# Patient Record
Sex: Female | Born: 1980 | Race: Black or African American | Marital: Single | Smoking: Former smoker
Health system: Southern US, Community
[De-identification: ages and names within clinical notes are randomized; demographics above are authoritative.]

## PROBLEM LIST (undated history)

## (undated) DIAGNOSIS — G4730 Sleep apnea, unspecified: Secondary | ICD-10-CM

## (undated) DIAGNOSIS — J45909 Unspecified asthma, uncomplicated: Secondary | ICD-10-CM

## (undated) DIAGNOSIS — M5137 Other intervertebral disc degeneration, lumbosacral region: Secondary | ICD-10-CM

## (undated) DIAGNOSIS — F32A Depression, unspecified: Secondary | ICD-10-CM

## (undated) DIAGNOSIS — D649 Anemia, unspecified: Secondary | ICD-10-CM

## (undated) DIAGNOSIS — K219 Gastro-esophageal reflux disease without esophagitis: Secondary | ICD-10-CM

## (undated) DIAGNOSIS — F419 Anxiety disorder, unspecified: Secondary | ICD-10-CM

## (undated) DIAGNOSIS — M51379 Other intervertebral disc degeneration, lumbosacral region without mention of lumbar back pain or lower extremity pain: Secondary | ICD-10-CM

## (undated) HISTORY — DX: Gastro-esophageal reflux disease without esophagitis: K21.9

## (undated) HISTORY — DX: Other intervertebral disc degeneration, lumbosacral region: M51.37

## (undated) HISTORY — DX: Sleep apnea, unspecified: G47.30

## (undated) HISTORY — DX: Other intervertebral disc degeneration, lumbosacral region without mention of lumbar back pain or lower extremity pain: M51.379

## (undated) HISTORY — DX: Depression, unspecified: F32.A

---

## 2008-10-20 DIAGNOSIS — L708 Other acne: Secondary | ICD-10-CM

## 2008-10-20 HISTORY — DX: Other acne: L70.8

## 2010-09-10 DIAGNOSIS — F329 Major depressive disorder, single episode, unspecified: Secondary | ICD-10-CM | POA: Insufficient documentation

## 2010-10-08 DIAGNOSIS — G43909 Migraine, unspecified, not intractable, without status migrainosus: Secondary | ICD-10-CM

## 2010-10-08 HISTORY — DX: Migraine, unspecified, not intractable, without status migrainosus: G43.909

## 2010-12-13 DIAGNOSIS — H6990 Unspecified Eustachian tube disorder, unspecified ear: Secondary | ICD-10-CM

## 2010-12-13 HISTORY — DX: Unspecified eustachian tube disorder, unspecified ear: H69.90

## 2011-04-05 DIAGNOSIS — R0789 Other chest pain: Secondary | ICD-10-CM

## 2011-04-05 DIAGNOSIS — Z8249 Family history of ischemic heart disease and other diseases of the circulatory system: Secondary | ICD-10-CM | POA: Insufficient documentation

## 2011-04-05 HISTORY — DX: Other chest pain: R07.89

## 2012-01-10 DIAGNOSIS — B977 Papillomavirus as the cause of diseases classified elsewhere: Secondary | ICD-10-CM | POA: Insufficient documentation

## 2014-08-08 DIAGNOSIS — F909 Attention-deficit hyperactivity disorder, unspecified type: Secondary | ICD-10-CM | POA: Insufficient documentation

## 2014-08-08 HISTORY — DX: Attention-deficit hyperactivity disorder, unspecified type: F90.9

## 2014-08-28 DIAGNOSIS — R87612 Low grade squamous intraepithelial lesion on cytologic smear of cervix (LGSIL): Secondary | ICD-10-CM | POA: Insufficient documentation

## 2014-10-17 DIAGNOSIS — M545 Low back pain, unspecified: Secondary | ICD-10-CM

## 2014-10-17 HISTORY — DX: Low back pain, unspecified: M54.50

## 2018-11-16 DIAGNOSIS — J4540 Moderate persistent asthma, uncomplicated: Secondary | ICD-10-CM | POA: Insufficient documentation

## 2018-11-16 DIAGNOSIS — J4530 Mild persistent asthma, uncomplicated: Secondary | ICD-10-CM | POA: Insufficient documentation

## 2020-11-11 DIAGNOSIS — Z20822 Contact with and (suspected) exposure to covid-19: Secondary | ICD-10-CM | POA: Diagnosis not present

## 2020-11-11 DIAGNOSIS — H6506 Acute serous otitis media, recurrent, bilateral: Secondary | ICD-10-CM | POA: Diagnosis not present

## 2020-11-11 DIAGNOSIS — J3489 Other specified disorders of nose and nasal sinuses: Secondary | ICD-10-CM | POA: Diagnosis not present

## 2020-11-11 DIAGNOSIS — J45909 Unspecified asthma, uncomplicated: Secondary | ICD-10-CM | POA: Diagnosis not present

## 2020-11-11 DIAGNOSIS — J0100 Acute maxillary sinusitis, unspecified: Secondary | ICD-10-CM | POA: Diagnosis not present

## 2020-11-14 DIAGNOSIS — L259 Unspecified contact dermatitis, unspecified cause: Secondary | ICD-10-CM | POA: Diagnosis not present

## 2020-11-15 DIAGNOSIS — R079 Chest pain, unspecified: Secondary | ICD-10-CM | POA: Diagnosis not present

## 2020-11-15 DIAGNOSIS — R202 Paresthesia of skin: Secondary | ICD-10-CM | POA: Diagnosis not present

## 2020-11-15 DIAGNOSIS — R0789 Other chest pain: Secondary | ICD-10-CM | POA: Diagnosis not present

## 2020-11-15 DIAGNOSIS — Z79899 Other long term (current) drug therapy: Secondary | ICD-10-CM | POA: Diagnosis not present

## 2020-11-15 DIAGNOSIS — F411 Generalized anxiety disorder: Secondary | ICD-10-CM | POA: Diagnosis not present

## 2020-11-15 DIAGNOSIS — R002 Palpitations: Secondary | ICD-10-CM | POA: Diagnosis not present

## 2020-11-15 DIAGNOSIS — I517 Cardiomegaly: Secondary | ICD-10-CM | POA: Diagnosis not present

## 2020-11-15 DIAGNOSIS — R0989 Other specified symptoms and signs involving the circulatory and respiratory systems: Secondary | ICD-10-CM | POA: Diagnosis not present

## 2020-11-15 DIAGNOSIS — Z7951 Long term (current) use of inhaled steroids: Secondary | ICD-10-CM | POA: Diagnosis not present

## 2020-11-15 DIAGNOSIS — R519 Headache, unspecified: Secondary | ICD-10-CM | POA: Diagnosis not present

## 2020-11-15 DIAGNOSIS — R200 Anesthesia of skin: Secondary | ICD-10-CM | POA: Diagnosis not present

## 2020-11-15 DIAGNOSIS — Z87891 Personal history of nicotine dependence: Secondary | ICD-10-CM | POA: Diagnosis not present

## 2020-11-17 DIAGNOSIS — D649 Anemia, unspecified: Secondary | ICD-10-CM | POA: Diagnosis not present

## 2020-11-17 DIAGNOSIS — F411 Generalized anxiety disorder: Secondary | ICD-10-CM | POA: Diagnosis not present

## 2020-11-17 DIAGNOSIS — F331 Major depressive disorder, recurrent, moderate: Secondary | ICD-10-CM | POA: Diagnosis not present

## 2020-11-17 DIAGNOSIS — E559 Vitamin D deficiency, unspecified: Secondary | ICD-10-CM | POA: Diagnosis not present

## 2020-11-17 DIAGNOSIS — R251 Tremor, unspecified: Secondary | ICD-10-CM | POA: Diagnosis not present

## 2020-11-17 DIAGNOSIS — R52 Pain, unspecified: Secondary | ICD-10-CM | POA: Diagnosis not present

## 2020-11-17 DIAGNOSIS — R3589 Other polyuria: Secondary | ICD-10-CM | POA: Diagnosis not present

## 2020-11-19 DIAGNOSIS — R21 Rash and other nonspecific skin eruption: Secondary | ICD-10-CM | POA: Diagnosis not present

## 2020-11-19 DIAGNOSIS — E559 Vitamin D deficiency, unspecified: Secondary | ICD-10-CM | POA: Diagnosis not present

## 2020-11-20 DIAGNOSIS — R202 Paresthesia of skin: Secondary | ICD-10-CM | POA: Diagnosis not present

## 2020-11-20 DIAGNOSIS — F410 Panic disorder [episodic paroxysmal anxiety] without agoraphobia: Secondary | ICD-10-CM | POA: Diagnosis not present

## 2020-11-20 DIAGNOSIS — R200 Anesthesia of skin: Secondary | ICD-10-CM | POA: Diagnosis not present

## 2020-11-20 DIAGNOSIS — R531 Weakness: Secondary | ICD-10-CM | POA: Diagnosis not present

## 2020-11-22 DIAGNOSIS — F410 Panic disorder [episodic paroxysmal anxiety] without agoraphobia: Secondary | ICD-10-CM | POA: Insufficient documentation

## 2020-11-22 HISTORY — DX: Panic disorder (episodic paroxysmal anxiety): F41.0

## 2020-11-23 DIAGNOSIS — F410 Panic disorder [episodic paroxysmal anxiety] without agoraphobia: Secondary | ICD-10-CM | POA: Diagnosis not present

## 2020-11-23 DIAGNOSIS — K529 Noninfective gastroenteritis and colitis, unspecified: Secondary | ICD-10-CM | POA: Diagnosis not present

## 2020-11-23 DIAGNOSIS — M545 Low back pain, unspecified: Secondary | ICD-10-CM | POA: Diagnosis not present

## 2020-11-23 DIAGNOSIS — F900 Attention-deficit hyperactivity disorder, predominantly inattentive type: Secondary | ICD-10-CM | POA: Diagnosis not present

## 2020-11-25 DIAGNOSIS — F410 Panic disorder [episodic paroxysmal anxiety] without agoraphobia: Secondary | ICD-10-CM | POA: Diagnosis not present

## 2020-11-25 DIAGNOSIS — D179 Benign lipomatous neoplasm, unspecified: Secondary | ICD-10-CM | POA: Diagnosis not present

## 2020-11-25 DIAGNOSIS — R202 Paresthesia of skin: Secondary | ICD-10-CM | POA: Diagnosis not present

## 2020-11-25 DIAGNOSIS — D649 Anemia, unspecified: Secondary | ICD-10-CM | POA: Diagnosis not present

## 2020-12-07 DIAGNOSIS — D179 Benign lipomatous neoplasm, unspecified: Secondary | ICD-10-CM | POA: Diagnosis not present

## 2020-12-07 DIAGNOSIS — Z23 Encounter for immunization: Secondary | ICD-10-CM | POA: Diagnosis not present

## 2020-12-08 DIAGNOSIS — J4540 Moderate persistent asthma, uncomplicated: Secondary | ICD-10-CM | POA: Diagnosis not present

## 2020-12-08 DIAGNOSIS — F419 Anxiety disorder, unspecified: Secondary | ICD-10-CM | POA: Insufficient documentation

## 2020-12-08 DIAGNOSIS — F901 Attention-deficit hyperactivity disorder, predominantly hyperactive type: Secondary | ICD-10-CM | POA: Diagnosis not present

## 2020-12-08 HISTORY — DX: Anxiety disorder, unspecified: F41.9

## 2020-12-11 DIAGNOSIS — R251 Tremor, unspecified: Secondary | ICD-10-CM | POA: Diagnosis not present

## 2020-12-11 DIAGNOSIS — F410 Panic disorder [episodic paroxysmal anxiety] without agoraphobia: Secondary | ICD-10-CM | POA: Diagnosis not present

## 2020-12-11 DIAGNOSIS — J4530 Mild persistent asthma, uncomplicated: Secondary | ICD-10-CM | POA: Diagnosis not present

## 2020-12-29 DIAGNOSIS — R0683 Snoring: Secondary | ICD-10-CM | POA: Diagnosis not present

## 2020-12-29 DIAGNOSIS — G4730 Sleep apnea, unspecified: Secondary | ICD-10-CM | POA: Diagnosis not present

## 2020-12-29 DIAGNOSIS — G4700 Insomnia, unspecified: Secondary | ICD-10-CM | POA: Diagnosis not present

## 2020-12-29 DIAGNOSIS — Z202 Contact with and (suspected) exposure to infections with a predominantly sexual mode of transmission: Secondary | ICD-10-CM | POA: Diagnosis not present

## 2021-01-05 DIAGNOSIS — G4700 Insomnia, unspecified: Secondary | ICD-10-CM

## 2021-01-05 HISTORY — DX: Insomnia, unspecified: G47.00

## 2021-01-14 DIAGNOSIS — G4700 Insomnia, unspecified: Secondary | ICD-10-CM | POA: Diagnosis not present

## 2021-01-14 DIAGNOSIS — G4730 Sleep apnea, unspecified: Secondary | ICD-10-CM | POA: Diagnosis not present

## 2021-01-14 DIAGNOSIS — R0683 Snoring: Secondary | ICD-10-CM | POA: Diagnosis not present

## 2021-01-25 DIAGNOSIS — R400 Somnolence: Secondary | ICD-10-CM | POA: Diagnosis not present

## 2021-01-25 DIAGNOSIS — G4700 Insomnia, unspecified: Secondary | ICD-10-CM | POA: Diagnosis not present

## 2021-01-25 DIAGNOSIS — F410 Panic disorder [episodic paroxysmal anxiety] without agoraphobia: Secondary | ICD-10-CM | POA: Diagnosis not present

## 2021-01-25 DIAGNOSIS — K219 Gastro-esophageal reflux disease without esophagitis: Secondary | ICD-10-CM | POA: Diagnosis not present

## 2021-01-25 DIAGNOSIS — E559 Vitamin D deficiency, unspecified: Secondary | ICD-10-CM | POA: Insufficient documentation

## 2021-01-25 DIAGNOSIS — F900 Attention-deficit hyperactivity disorder, predominantly inattentive type: Secondary | ICD-10-CM | POA: Diagnosis not present

## 2021-02-03 DIAGNOSIS — R251 Tremor, unspecified: Secondary | ICD-10-CM | POA: Diagnosis not present

## 2021-02-15 DIAGNOSIS — M5136 Other intervertebral disc degeneration, lumbar region: Secondary | ICD-10-CM | POA: Diagnosis not present

## 2021-02-15 DIAGNOSIS — M47816 Spondylosis without myelopathy or radiculopathy, lumbar region: Secondary | ICD-10-CM | POA: Diagnosis not present

## 2021-03-17 DIAGNOSIS — G4733 Obstructive sleep apnea (adult) (pediatric): Secondary | ICD-10-CM | POA: Diagnosis not present

## 2021-04-02 DIAGNOSIS — D649 Anemia, unspecified: Secondary | ICD-10-CM | POA: Diagnosis not present

## 2021-04-02 DIAGNOSIS — R609 Edema, unspecified: Secondary | ICD-10-CM | POA: Diagnosis not present

## 2021-04-02 DIAGNOSIS — E559 Vitamin D deficiency, unspecified: Secondary | ICD-10-CM | POA: Diagnosis not present

## 2021-04-02 DIAGNOSIS — M545 Low back pain, unspecified: Secondary | ICD-10-CM | POA: Diagnosis not present

## 2021-04-06 DIAGNOSIS — N907 Vulvar cyst: Secondary | ICD-10-CM | POA: Diagnosis not present

## 2021-04-06 DIAGNOSIS — Z1151 Encounter for screening for human papillomavirus (HPV): Secondary | ICD-10-CM | POA: Diagnosis not present

## 2021-04-06 DIAGNOSIS — Z124 Encounter for screening for malignant neoplasm of cervix: Secondary | ICD-10-CM | POA: Diagnosis not present

## 2021-04-06 DIAGNOSIS — B373 Candidiasis of vulva and vagina: Secondary | ICD-10-CM | POA: Diagnosis not present

## 2021-04-06 LAB — HM PAP SMEAR

## 2021-04-16 DIAGNOSIS — R609 Edema, unspecified: Secondary | ICD-10-CM | POA: Diagnosis not present

## 2021-04-16 DIAGNOSIS — G4733 Obstructive sleep apnea (adult) (pediatric): Secondary | ICD-10-CM | POA: Diagnosis not present

## 2021-05-17 DIAGNOSIS — G4733 Obstructive sleep apnea (adult) (pediatric): Secondary | ICD-10-CM | POA: Diagnosis not present

## 2021-06-17 DIAGNOSIS — G4733 Obstructive sleep apnea (adult) (pediatric): Secondary | ICD-10-CM | POA: Diagnosis not present

## 2021-06-24 DIAGNOSIS — M5136 Other intervertebral disc degeneration, lumbar region: Secondary | ICD-10-CM | POA: Diagnosis not present

## 2021-06-24 DIAGNOSIS — I10 Essential (primary) hypertension: Secondary | ICD-10-CM | POA: Diagnosis not present

## 2021-06-24 DIAGNOSIS — M47816 Spondylosis without myelopathy or radiculopathy, lumbar region: Secondary | ICD-10-CM | POA: Diagnosis not present

## 2021-06-24 DIAGNOSIS — Z6841 Body Mass Index (BMI) 40.0 and over, adult: Secondary | ICD-10-CM | POA: Diagnosis not present

## 2021-07-14 DIAGNOSIS — M5441 Lumbago with sciatica, right side: Secondary | ICD-10-CM | POA: Diagnosis not present

## 2021-07-14 DIAGNOSIS — G8929 Other chronic pain: Secondary | ICD-10-CM | POA: Diagnosis not present

## 2021-07-14 DIAGNOSIS — B3731 Acute candidiasis of vulva and vagina: Secondary | ICD-10-CM | POA: Diagnosis not present

## 2021-08-02 DIAGNOSIS — R7301 Impaired fasting glucose: Secondary | ICD-10-CM | POA: Diagnosis not present

## 2021-08-02 DIAGNOSIS — Z6841 Body Mass Index (BMI) 40.0 and over, adult: Secondary | ICD-10-CM | POA: Diagnosis not present

## 2021-08-02 DIAGNOSIS — F411 Generalized anxiety disorder: Secondary | ICD-10-CM | POA: Diagnosis not present

## 2021-09-01 DIAGNOSIS — R079 Chest pain, unspecified: Secondary | ICD-10-CM | POA: Diagnosis not present

## 2021-09-16 DIAGNOSIS — F419 Anxiety disorder, unspecified: Secondary | ICD-10-CM | POA: Diagnosis not present

## 2021-09-16 DIAGNOSIS — R051 Acute cough: Secondary | ICD-10-CM | POA: Diagnosis not present

## 2021-09-16 DIAGNOSIS — Z20822 Contact with and (suspected) exposure to covid-19: Secondary | ICD-10-CM | POA: Diagnosis not present

## 2021-09-16 DIAGNOSIS — R0981 Nasal congestion: Secondary | ICD-10-CM | POA: Diagnosis not present

## 2021-09-22 DIAGNOSIS — R42 Dizziness and giddiness: Secondary | ICD-10-CM | POA: Diagnosis not present

## 2021-09-22 DIAGNOSIS — R109 Unspecified abdominal pain: Secondary | ICD-10-CM | POA: Diagnosis not present

## 2021-09-22 DIAGNOSIS — F410 Panic disorder [episodic paroxysmal anxiety] without agoraphobia: Secondary | ICD-10-CM | POA: Diagnosis not present

## 2021-09-22 DIAGNOSIS — D649 Anemia, unspecified: Secondary | ICD-10-CM | POA: Diagnosis not present

## 2021-09-22 DIAGNOSIS — F5231 Female orgasmic disorder: Secondary | ICD-10-CM | POA: Diagnosis not present

## 2021-09-24 DIAGNOSIS — Z79899 Other long term (current) drug therapy: Secondary | ICD-10-CM | POA: Diagnosis not present

## 2021-09-24 DIAGNOSIS — R42 Dizziness and giddiness: Secondary | ICD-10-CM | POA: Diagnosis not present

## 2021-09-24 DIAGNOSIS — Z87891 Personal history of nicotine dependence: Secondary | ICD-10-CM | POA: Diagnosis not present

## 2021-09-24 DIAGNOSIS — R0789 Other chest pain: Secondary | ICD-10-CM | POA: Diagnosis not present

## 2021-09-24 DIAGNOSIS — Z7985 Long-term (current) use of injectable non-insulin antidiabetic drugs: Secondary | ICD-10-CM | POA: Diagnosis not present

## 2021-09-24 DIAGNOSIS — F419 Anxiety disorder, unspecified: Secondary | ICD-10-CM | POA: Diagnosis not present

## 2021-09-24 DIAGNOSIS — F4321 Adjustment disorder with depressed mood: Secondary | ICD-10-CM | POA: Diagnosis not present

## 2021-09-24 DIAGNOSIS — Z7951 Long term (current) use of inhaled steroids: Secondary | ICD-10-CM | POA: Diagnosis not present

## 2021-09-24 DIAGNOSIS — R202 Paresthesia of skin: Secondary | ICD-10-CM | POA: Diagnosis not present

## 2021-09-24 DIAGNOSIS — R200 Anesthesia of skin: Secondary | ICD-10-CM | POA: Diagnosis not present

## 2021-09-24 DIAGNOSIS — F439 Reaction to severe stress, unspecified: Secondary | ICD-10-CM | POA: Diagnosis not present

## 2021-09-25 DIAGNOSIS — R000 Tachycardia, unspecified: Secondary | ICD-10-CM | POA: Diagnosis not present

## 2021-09-27 DIAGNOSIS — F411 Generalized anxiety disorder: Secondary | ICD-10-CM | POA: Diagnosis not present

## 2021-09-27 DIAGNOSIS — R42 Dizziness and giddiness: Secondary | ICD-10-CM | POA: Diagnosis not present

## 2021-09-27 DIAGNOSIS — R457 State of emotional shock and stress, unspecified: Secondary | ICD-10-CM | POA: Diagnosis not present

## 2021-09-27 DIAGNOSIS — R000 Tachycardia, unspecified: Secondary | ICD-10-CM | POA: Diagnosis not present

## 2021-09-27 DIAGNOSIS — F410 Panic disorder [episodic paroxysmal anxiety] without agoraphobia: Secondary | ICD-10-CM | POA: Diagnosis not present

## 2021-09-27 DIAGNOSIS — R064 Hyperventilation: Secondary | ICD-10-CM | POA: Diagnosis not present

## 2021-09-29 DIAGNOSIS — Z3202 Encounter for pregnancy test, result negative: Secondary | ICD-10-CM | POA: Diagnosis not present

## 2021-09-29 DIAGNOSIS — R42 Dizziness and giddiness: Secondary | ICD-10-CM | POA: Diagnosis not present

## 2021-09-29 DIAGNOSIS — R87810 Cervical high risk human papillomavirus (HPV) DNA test positive: Secondary | ICD-10-CM | POA: Diagnosis not present

## 2021-09-29 DIAGNOSIS — N644 Mastodynia: Secondary | ICD-10-CM | POA: Diagnosis not present

## 2021-09-29 DIAGNOSIS — R4020 Unspecified coma: Secondary | ICD-10-CM | POA: Diagnosis not present

## 2021-09-29 DIAGNOSIS — R87612 Low grade squamous intraepithelial lesion on cytologic smear of cervix (LGSIL): Secondary | ICD-10-CM | POA: Diagnosis not present

## 2021-09-29 DIAGNOSIS — N72 Inflammatory disease of cervix uteri: Secondary | ICD-10-CM | POA: Diagnosis not present

## 2021-09-29 DIAGNOSIS — R457 State of emotional shock and stress, unspecified: Secondary | ICD-10-CM | POA: Diagnosis not present

## 2021-09-29 DIAGNOSIS — B3731 Acute candidiasis of vulva and vagina: Secondary | ICD-10-CM | POA: Diagnosis not present

## 2021-09-29 DIAGNOSIS — Z23 Encounter for immunization: Secondary | ICD-10-CM | POA: Diagnosis not present

## 2021-09-30 DIAGNOSIS — R072 Precordial pain: Secondary | ICD-10-CM | POA: Diagnosis not present

## 2021-09-30 DIAGNOSIS — R079 Chest pain, unspecified: Secondary | ICD-10-CM | POA: Diagnosis not present

## 2021-09-30 DIAGNOSIS — R55 Syncope and collapse: Secondary | ICD-10-CM | POA: Diagnosis not present

## 2021-09-30 DIAGNOSIS — Z87891 Personal history of nicotine dependence: Secondary | ICD-10-CM | POA: Diagnosis not present

## 2021-09-30 DIAGNOSIS — R42 Dizziness and giddiness: Secondary | ICD-10-CM | POA: Diagnosis not present

## 2021-09-30 DIAGNOSIS — R1013 Epigastric pain: Secondary | ICD-10-CM | POA: Diagnosis not present

## 2021-09-30 DIAGNOSIS — R0789 Other chest pain: Secondary | ICD-10-CM | POA: Diagnosis not present

## 2021-09-30 DIAGNOSIS — Z79899 Other long term (current) drug therapy: Secondary | ICD-10-CM | POA: Diagnosis not present

## 2021-09-30 DIAGNOSIS — R12 Heartburn: Secondary | ICD-10-CM | POA: Diagnosis not present

## 2021-09-30 DIAGNOSIS — R000 Tachycardia, unspecified: Secondary | ICD-10-CM | POA: Diagnosis not present

## 2021-10-01 DIAGNOSIS — R55 Syncope and collapse: Secondary | ICD-10-CM | POA: Diagnosis not present

## 2021-10-01 DIAGNOSIS — F410 Panic disorder [episodic paroxysmal anxiety] without agoraphobia: Secondary | ICD-10-CM | POA: Diagnosis not present

## 2021-10-01 DIAGNOSIS — G4700 Insomnia, unspecified: Secondary | ICD-10-CM | POA: Diagnosis not present

## 2021-10-02 DIAGNOSIS — R000 Tachycardia, unspecified: Secondary | ICD-10-CM | POA: Diagnosis not present

## 2021-10-02 DIAGNOSIS — R457 State of emotional shock and stress, unspecified: Secondary | ICD-10-CM | POA: Diagnosis not present

## 2021-10-02 DIAGNOSIS — R202 Paresthesia of skin: Secondary | ICD-10-CM | POA: Diagnosis not present

## 2021-10-05 DIAGNOSIS — M5413 Radiculopathy, cervicothoracic region: Secondary | ICD-10-CM | POA: Diagnosis not present

## 2021-10-05 DIAGNOSIS — F331 Major depressive disorder, recurrent, moderate: Secondary | ICD-10-CM | POA: Diagnosis not present

## 2021-10-06 DIAGNOSIS — R42 Dizziness and giddiness: Secondary | ICD-10-CM | POA: Diagnosis not present

## 2021-10-06 DIAGNOSIS — R450 Nervousness: Secondary | ICD-10-CM | POA: Diagnosis not present

## 2021-10-06 DIAGNOSIS — R5383 Other fatigue: Secondary | ICD-10-CM | POA: Diagnosis not present

## 2021-10-06 DIAGNOSIS — R0902 Hypoxemia: Secondary | ICD-10-CM | POA: Diagnosis not present

## 2021-10-06 DIAGNOSIS — R000 Tachycardia, unspecified: Secondary | ICD-10-CM | POA: Diagnosis not present

## 2021-10-06 DIAGNOSIS — R0789 Other chest pain: Secondary | ICD-10-CM | POA: Diagnosis not present

## 2021-10-06 DIAGNOSIS — R079 Chest pain, unspecified: Secondary | ICD-10-CM | POA: Diagnosis not present

## 2021-10-06 DIAGNOSIS — R519 Headache, unspecified: Secondary | ICD-10-CM | POA: Diagnosis not present

## 2021-10-06 DIAGNOSIS — Z79899 Other long term (current) drug therapy: Secondary | ICD-10-CM | POA: Diagnosis not present

## 2021-10-06 DIAGNOSIS — Z6841 Body Mass Index (BMI) 40.0 and over, adult: Secondary | ICD-10-CM | POA: Diagnosis not present

## 2021-10-06 DIAGNOSIS — M5000 Cervical disc disorder with myelopathy, unspecified cervical region: Secondary | ICD-10-CM | POA: Diagnosis not present

## 2021-10-06 DIAGNOSIS — R200 Anesthesia of skin: Secondary | ICD-10-CM | POA: Diagnosis not present

## 2021-10-06 DIAGNOSIS — M4722 Other spondylosis with radiculopathy, cervical region: Secondary | ICD-10-CM | POA: Diagnosis not present

## 2021-10-06 DIAGNOSIS — R202 Paresthesia of skin: Secondary | ICD-10-CM | POA: Diagnosis not present

## 2021-10-06 DIAGNOSIS — Z87891 Personal history of nicotine dependence: Secondary | ICD-10-CM | POA: Diagnosis not present

## 2021-10-06 DIAGNOSIS — F32A Depression, unspecified: Secondary | ICD-10-CM | POA: Diagnosis not present

## 2021-10-06 DIAGNOSIS — R457 State of emotional shock and stress, unspecified: Secondary | ICD-10-CM | POA: Diagnosis not present

## 2021-10-06 DIAGNOSIS — J984 Other disorders of lung: Secondary | ICD-10-CM | POA: Diagnosis not present

## 2021-10-06 DIAGNOSIS — R251 Tremor, unspecified: Secondary | ICD-10-CM | POA: Diagnosis not present

## 2021-10-06 DIAGNOSIS — M542 Cervicalgia: Secondary | ICD-10-CM | POA: Diagnosis not present

## 2021-10-06 DIAGNOSIS — I10 Essential (primary) hypertension: Secondary | ICD-10-CM | POA: Diagnosis not present

## 2021-10-06 DIAGNOSIS — Z20822 Contact with and (suspected) exposure to covid-19: Secondary | ICD-10-CM | POA: Diagnosis not present

## 2021-10-06 DIAGNOSIS — R7989 Other specified abnormal findings of blood chemistry: Secondary | ICD-10-CM | POA: Diagnosis not present

## 2021-10-06 DIAGNOSIS — F419 Anxiety disorder, unspecified: Secondary | ICD-10-CM | POA: Diagnosis not present

## 2021-10-06 DIAGNOSIS — M549 Dorsalgia, unspecified: Secondary | ICD-10-CM | POA: Diagnosis not present

## 2021-10-07 DIAGNOSIS — R0789 Other chest pain: Secondary | ICD-10-CM | POA: Diagnosis not present

## 2021-10-07 DIAGNOSIS — R7989 Other specified abnormal findings of blood chemistry: Secondary | ICD-10-CM | POA: Diagnosis not present

## 2021-10-07 DIAGNOSIS — R450 Nervousness: Secondary | ICD-10-CM | POA: Diagnosis not present

## 2021-10-07 DIAGNOSIS — R519 Headache, unspecified: Secondary | ICD-10-CM | POA: Diagnosis not present

## 2021-10-07 DIAGNOSIS — R251 Tremor, unspecified: Secondary | ICD-10-CM | POA: Diagnosis not present

## 2021-10-07 DIAGNOSIS — R5383 Other fatigue: Secondary | ICD-10-CM | POA: Diagnosis not present

## 2021-10-07 DIAGNOSIS — J984 Other disorders of lung: Secondary | ICD-10-CM | POA: Diagnosis not present

## 2021-10-07 DIAGNOSIS — M549 Dorsalgia, unspecified: Secondary | ICD-10-CM | POA: Diagnosis not present

## 2021-10-07 DIAGNOSIS — F32A Depression, unspecified: Secondary | ICD-10-CM | POA: Diagnosis not present

## 2021-10-07 DIAGNOSIS — Z87891 Personal history of nicotine dependence: Secondary | ICD-10-CM | POA: Diagnosis not present

## 2021-10-07 DIAGNOSIS — R202 Paresthesia of skin: Secondary | ICD-10-CM | POA: Diagnosis not present

## 2021-10-07 DIAGNOSIS — Z79899 Other long term (current) drug therapy: Secondary | ICD-10-CM | POA: Diagnosis not present

## 2021-10-07 DIAGNOSIS — F419 Anxiety disorder, unspecified: Secondary | ICD-10-CM | POA: Diagnosis not present

## 2021-10-08 ENCOUNTER — Other Ambulatory Visit: Payer: Self-pay | Admitting: Orthopaedic Surgery

## 2021-10-08 DIAGNOSIS — M5459 Other low back pain: Secondary | ICD-10-CM | POA: Diagnosis not present

## 2021-10-08 DIAGNOSIS — R251 Tremor, unspecified: Secondary | ICD-10-CM | POA: Diagnosis not present

## 2021-10-08 DIAGNOSIS — F32A Depression, unspecified: Secondary | ICD-10-CM | POA: Diagnosis not present

## 2021-10-08 DIAGNOSIS — Z87891 Personal history of nicotine dependence: Secondary | ICD-10-CM | POA: Diagnosis not present

## 2021-10-08 DIAGNOSIS — R079 Chest pain, unspecified: Secondary | ICD-10-CM | POA: Diagnosis not present

## 2021-10-08 DIAGNOSIS — R5383 Other fatigue: Secondary | ICD-10-CM | POA: Diagnosis not present

## 2021-10-08 DIAGNOSIS — R42 Dizziness and giddiness: Secondary | ICD-10-CM | POA: Diagnosis not present

## 2021-10-08 DIAGNOSIS — Z79899 Other long term (current) drug therapy: Secondary | ICD-10-CM | POA: Diagnosis not present

## 2021-10-08 DIAGNOSIS — M4722 Other spondylosis with radiculopathy, cervical region: Secondary | ICD-10-CM

## 2021-10-08 DIAGNOSIS — R0789 Other chest pain: Secondary | ICD-10-CM | POA: Diagnosis not present

## 2021-10-08 DIAGNOSIS — F419 Anxiety disorder, unspecified: Secondary | ICD-10-CM | POA: Diagnosis not present

## 2021-10-08 DIAGNOSIS — H539 Unspecified visual disturbance: Secondary | ICD-10-CM | POA: Diagnosis not present

## 2021-10-08 DIAGNOSIS — R072 Precordial pain: Secondary | ICD-10-CM | POA: Diagnosis not present

## 2021-10-08 DIAGNOSIS — M7989 Other specified soft tissue disorders: Secondary | ICD-10-CM | POA: Diagnosis not present

## 2021-10-08 DIAGNOSIS — R200 Anesthesia of skin: Secondary | ICD-10-CM | POA: Diagnosis not present

## 2021-10-08 DIAGNOSIS — M5000 Cervical disc disorder with myelopathy, unspecified cervical region: Secondary | ICD-10-CM

## 2021-10-09 DIAGNOSIS — R079 Chest pain, unspecified: Secondary | ICD-10-CM | POA: Diagnosis not present

## 2021-10-09 DIAGNOSIS — M7989 Other specified soft tissue disorders: Secondary | ICD-10-CM | POA: Diagnosis not present

## 2021-10-13 DIAGNOSIS — R55 Syncope and collapse: Secondary | ICD-10-CM | POA: Diagnosis not present

## 2021-10-15 DIAGNOSIS — R002 Palpitations: Secondary | ICD-10-CM | POA: Diagnosis not present

## 2021-10-15 DIAGNOSIS — Z1152 Encounter for screening for COVID-19: Secondary | ICD-10-CM | POA: Diagnosis not present

## 2021-10-15 DIAGNOSIS — I499 Cardiac arrhythmia, unspecified: Secondary | ICD-10-CM | POA: Diagnosis not present

## 2021-10-15 DIAGNOSIS — R000 Tachycardia, unspecified: Secondary | ICD-10-CM | POA: Diagnosis not present

## 2021-10-15 DIAGNOSIS — R051 Acute cough: Secondary | ICD-10-CM | POA: Diagnosis not present

## 2021-10-17 DIAGNOSIS — R531 Weakness: Secondary | ICD-10-CM | POA: Diagnosis not present

## 2021-10-19 DIAGNOSIS — K219 Gastro-esophageal reflux disease without esophagitis: Secondary | ICD-10-CM | POA: Diagnosis not present

## 2021-10-19 DIAGNOSIS — K209 Esophagitis, unspecified without bleeding: Secondary | ICD-10-CM | POA: Diagnosis not present

## 2021-10-20 DIAGNOSIS — R200 Anesthesia of skin: Secondary | ICD-10-CM | POA: Diagnosis not present

## 2021-10-20 DIAGNOSIS — H539 Unspecified visual disturbance: Secondary | ICD-10-CM | POA: Diagnosis not present

## 2021-10-20 DIAGNOSIS — R42 Dizziness and giddiness: Secondary | ICD-10-CM | POA: Diagnosis not present

## 2021-10-23 ENCOUNTER — Ambulatory Visit
Admission: RE | Admit: 2021-10-23 | Discharge: 2021-10-23 | Disposition: A | Discharge status: Home / Self Care | Payer: Federal, State, Local not specified - PPO | Source: Ambulatory Visit | Attending: Orthopaedic Surgery | Admitting: Orthopaedic Surgery

## 2021-10-23 ENCOUNTER — Other Ambulatory Visit: Payer: Self-pay

## 2021-10-23 DIAGNOSIS — R200 Anesthesia of skin: Secondary | ICD-10-CM | POA: Diagnosis not present

## 2021-10-23 DIAGNOSIS — M4722 Other spondylosis with radiculopathy, cervical region: Secondary | ICD-10-CM

## 2021-10-23 DIAGNOSIS — M5000 Cervical disc disorder with myelopathy, unspecified cervical region: Secondary | ICD-10-CM

## 2021-10-24 DIAGNOSIS — Z87891 Personal history of nicotine dependence: Secondary | ICD-10-CM | POA: Diagnosis not present

## 2021-10-24 DIAGNOSIS — R079 Chest pain, unspecified: Secondary | ICD-10-CM | POA: Diagnosis not present

## 2021-10-24 DIAGNOSIS — R040 Epistaxis: Secondary | ICD-10-CM | POA: Diagnosis not present

## 2021-10-24 DIAGNOSIS — I10 Essential (primary) hypertension: Secondary | ICD-10-CM | POA: Diagnosis not present

## 2021-10-26 DIAGNOSIS — F900 Attention-deficit hyperactivity disorder, predominantly inattentive type: Secondary | ICD-10-CM | POA: Diagnosis not present

## 2021-10-26 DIAGNOSIS — G8929 Other chronic pain: Secondary | ICD-10-CM | POA: Diagnosis not present

## 2021-10-26 DIAGNOSIS — M546 Pain in thoracic spine: Secondary | ICD-10-CM | POA: Diagnosis not present

## 2021-10-26 DIAGNOSIS — F410 Panic disorder [episodic paroxysmal anxiety] without agoraphobia: Secondary | ICD-10-CM | POA: Diagnosis not present

## 2021-10-28 DIAGNOSIS — M546 Pain in thoracic spine: Secondary | ICD-10-CM | POA: Diagnosis not present

## 2021-10-28 DIAGNOSIS — F331 Major depressive disorder, recurrent, moderate: Secondary | ICD-10-CM | POA: Diagnosis not present

## 2021-10-28 DIAGNOSIS — M4804 Spinal stenosis, thoracic region: Secondary | ICD-10-CM | POA: Diagnosis not present

## 2021-10-28 DIAGNOSIS — M4722 Other spondylosis with radiculopathy, cervical region: Secondary | ICD-10-CM | POA: Diagnosis not present

## 2021-10-28 DIAGNOSIS — I10 Essential (primary) hypertension: Secondary | ICD-10-CM | POA: Diagnosis not present

## 2021-10-29 DIAGNOSIS — K080 Exfoliation of teeth due to systemic causes: Secondary | ICD-10-CM | POA: Diagnosis not present

## 2021-11-03 DIAGNOSIS — R55 Syncope and collapse: Secondary | ICD-10-CM | POA: Diagnosis not present

## 2021-11-06 DIAGNOSIS — F331 Major depressive disorder, recurrent, moderate: Secondary | ICD-10-CM | POA: Diagnosis not present

## 2021-11-11 DIAGNOSIS — F410 Panic disorder [episodic paroxysmal anxiety] without agoraphobia: Secondary | ICD-10-CM | POA: Diagnosis not present

## 2021-11-11 DIAGNOSIS — F331 Major depressive disorder, recurrent, moderate: Secondary | ICD-10-CM | POA: Diagnosis not present

## 2021-11-11 DIAGNOSIS — Z6841 Body Mass Index (BMI) 40.0 and over, adult: Secondary | ICD-10-CM | POA: Diagnosis not present

## 2021-11-11 DIAGNOSIS — K219 Gastro-esophageal reflux disease without esophagitis: Secondary | ICD-10-CM | POA: Diagnosis not present

## 2021-12-30 DIAGNOSIS — M545 Low back pain, unspecified: Secondary | ICD-10-CM | POA: Diagnosis not present

## 2021-12-30 DIAGNOSIS — F900 Attention-deficit hyperactivity disorder, predominantly inattentive type: Secondary | ICD-10-CM | POA: Diagnosis not present

## 2021-12-30 DIAGNOSIS — F410 Panic disorder [episodic paroxysmal anxiety] without agoraphobia: Secondary | ICD-10-CM | POA: Diagnosis not present

## 2022-01-11 DIAGNOSIS — R062 Wheezing: Secondary | ICD-10-CM | POA: Diagnosis not present

## 2022-01-11 DIAGNOSIS — R002 Palpitations: Secondary | ICD-10-CM | POA: Diagnosis not present

## 2022-01-11 DIAGNOSIS — R000 Tachycardia, unspecified: Secondary | ICD-10-CM | POA: Diagnosis not present

## 2022-01-11 DIAGNOSIS — R0689 Other abnormalities of breathing: Secondary | ICD-10-CM | POA: Diagnosis not present

## 2022-01-13 DIAGNOSIS — R202 Paresthesia of skin: Secondary | ICD-10-CM | POA: Diagnosis not present

## 2022-01-13 DIAGNOSIS — H539 Unspecified visual disturbance: Secondary | ICD-10-CM | POA: Diagnosis not present

## 2022-01-13 DIAGNOSIS — R200 Anesthesia of skin: Secondary | ICD-10-CM | POA: Diagnosis not present

## 2022-01-13 DIAGNOSIS — R42 Dizziness and giddiness: Secondary | ICD-10-CM | POA: Diagnosis not present

## 2022-01-17 DIAGNOSIS — R202 Paresthesia of skin: Secondary | ICD-10-CM | POA: Diagnosis not present

## 2022-01-17 DIAGNOSIS — R410 Disorientation, unspecified: Secondary | ICD-10-CM | POA: Diagnosis not present

## 2022-01-17 DIAGNOSIS — R531 Weakness: Secondary | ICD-10-CM | POA: Diagnosis not present

## 2022-01-17 DIAGNOSIS — F419 Anxiety disorder, unspecified: Secondary | ICD-10-CM | POA: Diagnosis not present

## 2022-01-17 DIAGNOSIS — R42 Dizziness and giddiness: Secondary | ICD-10-CM | POA: Diagnosis not present

## 2022-01-17 DIAGNOSIS — R208 Other disturbances of skin sensation: Secondary | ICD-10-CM | POA: Diagnosis not present

## 2022-01-17 DIAGNOSIS — G459 Transient cerebral ischemic attack, unspecified: Secondary | ICD-10-CM | POA: Diagnosis not present

## 2022-01-17 DIAGNOSIS — Z87891 Personal history of nicotine dependence: Secondary | ICD-10-CM | POA: Diagnosis not present

## 2022-01-17 DIAGNOSIS — Z79899 Other long term (current) drug therapy: Secondary | ICD-10-CM | POA: Diagnosis not present

## 2022-01-17 DIAGNOSIS — R200 Anesthesia of skin: Secondary | ICD-10-CM | POA: Diagnosis not present

## 2022-01-19 DIAGNOSIS — R42 Dizziness and giddiness: Secondary | ICD-10-CM | POA: Diagnosis not present

## 2022-01-19 DIAGNOSIS — H538 Other visual disturbances: Secondary | ICD-10-CM | POA: Diagnosis not present

## 2022-01-19 DIAGNOSIS — R251 Tremor, unspecified: Secondary | ICD-10-CM | POA: Diagnosis not present

## 2022-01-19 DIAGNOSIS — M6281 Muscle weakness (generalized): Secondary | ICD-10-CM | POA: Diagnosis not present

## 2022-01-21 DIAGNOSIS — F900 Attention-deficit hyperactivity disorder, predominantly inattentive type: Secondary | ICD-10-CM | POA: Diagnosis not present

## 2022-01-21 DIAGNOSIS — R233 Spontaneous ecchymoses: Secondary | ICD-10-CM | POA: Diagnosis not present

## 2022-01-21 DIAGNOSIS — G4700 Insomnia, unspecified: Secondary | ICD-10-CM | POA: Diagnosis not present

## 2022-01-21 DIAGNOSIS — E559 Vitamin D deficiency, unspecified: Secondary | ICD-10-CM | POA: Diagnosis not present

## 2022-01-21 DIAGNOSIS — R200 Anesthesia of skin: Secondary | ICD-10-CM | POA: Diagnosis not present

## 2022-01-21 DIAGNOSIS — D649 Anemia, unspecified: Secondary | ICD-10-CM | POA: Diagnosis not present

## 2022-01-21 DIAGNOSIS — F410 Panic disorder [episodic paroxysmal anxiety] without agoraphobia: Secondary | ICD-10-CM | POA: Diagnosis not present

## 2022-01-24 DIAGNOSIS — R457 State of emotional shock and stress, unspecified: Secondary | ICD-10-CM | POA: Diagnosis not present

## 2022-01-27 DIAGNOSIS — F331 Major depressive disorder, recurrent, moderate: Secondary | ICD-10-CM | POA: Diagnosis not present

## 2022-01-27 DIAGNOSIS — R079 Chest pain, unspecified: Secondary | ICD-10-CM | POA: Diagnosis not present

## 2022-01-27 DIAGNOSIS — G4489 Other headache syndrome: Secondary | ICD-10-CM | POA: Diagnosis not present

## 2022-01-27 DIAGNOSIS — I10 Essential (primary) hypertension: Secondary | ICD-10-CM | POA: Diagnosis not present

## 2022-01-27 DIAGNOSIS — R0789 Other chest pain: Secondary | ICD-10-CM | POA: Diagnosis not present

## 2022-01-28 DIAGNOSIS — R002 Palpitations: Secondary | ICD-10-CM | POA: Diagnosis not present

## 2022-01-28 DIAGNOSIS — E785 Hyperlipidemia, unspecified: Secondary | ICD-10-CM | POA: Diagnosis not present

## 2022-03-14 DIAGNOSIS — E785 Hyperlipidemia, unspecified: Secondary | ICD-10-CM | POA: Diagnosis not present

## 2022-03-14 DIAGNOSIS — M79605 Pain in left leg: Secondary | ICD-10-CM | POA: Diagnosis not present

## 2022-03-14 DIAGNOSIS — R002 Palpitations: Secondary | ICD-10-CM | POA: Diagnosis not present

## 2022-03-14 DIAGNOSIS — R233 Spontaneous ecchymoses: Secondary | ICD-10-CM | POA: Diagnosis not present

## 2022-03-15 DIAGNOSIS — Z13220 Encounter for screening for lipoid disorders: Secondary | ICD-10-CM | POA: Diagnosis not present

## 2022-03-15 DIAGNOSIS — E236 Other disorders of pituitary gland: Secondary | ICD-10-CM | POA: Insufficient documentation

## 2022-03-15 DIAGNOSIS — R42 Dizziness and giddiness: Secondary | ICD-10-CM | POA: Diagnosis not present

## 2022-03-15 DIAGNOSIS — R000 Tachycardia, unspecified: Secondary | ICD-10-CM | POA: Diagnosis not present

## 2022-03-15 DIAGNOSIS — H6993 Unspecified Eustachian tube disorder, bilateral: Secondary | ICD-10-CM | POA: Diagnosis not present

## 2022-03-21 DIAGNOSIS — R002 Palpitations: Secondary | ICD-10-CM | POA: Diagnosis not present

## 2022-03-21 DIAGNOSIS — R0609 Other forms of dyspnea: Secondary | ICD-10-CM | POA: Insufficient documentation

## 2022-03-21 DIAGNOSIS — M940 Chondrocostal junction syndrome [Tietze]: Secondary | ICD-10-CM | POA: Diagnosis not present

## 2022-03-21 DIAGNOSIS — R071 Chest pain on breathing: Secondary | ICD-10-CM | POA: Diagnosis not present

## 2022-03-21 DIAGNOSIS — R0789 Other chest pain: Secondary | ICD-10-CM | POA: Diagnosis not present

## 2022-03-21 DIAGNOSIS — F331 Major depressive disorder, recurrent, moderate: Secondary | ICD-10-CM | POA: Diagnosis not present

## 2022-03-21 HISTORY — DX: Other forms of dyspnea: R06.09

## 2022-03-21 HISTORY — DX: Palpitations: R00.2

## 2022-03-24 DIAGNOSIS — E559 Vitamin D deficiency, unspecified: Secondary | ICD-10-CM | POA: Diagnosis not present

## 2022-03-24 DIAGNOSIS — M79605 Pain in left leg: Secondary | ICD-10-CM | POA: Diagnosis not present

## 2022-03-24 DIAGNOSIS — B977 Papillomavirus as the cause of diseases classified elsewhere: Secondary | ICD-10-CM | POA: Diagnosis not present

## 2022-03-24 DIAGNOSIS — R233 Spontaneous ecchymoses: Secondary | ICD-10-CM | POA: Diagnosis not present

## 2022-03-24 DIAGNOSIS — D500 Iron deficiency anemia secondary to blood loss (chronic): Secondary | ICD-10-CM | POA: Insufficient documentation

## 2022-03-24 DIAGNOSIS — F411 Generalized anxiety disorder: Secondary | ICD-10-CM | POA: Diagnosis not present

## 2022-03-24 DIAGNOSIS — T148XXA Other injury of unspecified body region, initial encounter: Secondary | ICD-10-CM | POA: Diagnosis not present

## 2022-03-24 HISTORY — DX: Other injury of unspecified body region, initial encounter: T14.8XXA

## 2022-03-28 DIAGNOSIS — T148XXA Other injury of unspecified body region, initial encounter: Secondary | ICD-10-CM | POA: Diagnosis not present

## 2022-03-31 DIAGNOSIS — R002 Palpitations: Secondary | ICD-10-CM | POA: Diagnosis not present

## 2022-03-31 DIAGNOSIS — M79605 Pain in left leg: Secondary | ICD-10-CM | POA: Diagnosis not present

## 2022-04-08 DIAGNOSIS — I83819 Varicose veins of unspecified lower extremities with pain: Secondary | ICD-10-CM | POA: Diagnosis not present

## 2022-04-08 DIAGNOSIS — M25561 Pain in right knee: Secondary | ICD-10-CM | POA: Diagnosis not present

## 2022-04-13 DIAGNOSIS — Z6841 Body Mass Index (BMI) 40.0 and over, adult: Secondary | ICD-10-CM | POA: Diagnosis not present

## 2022-04-13 DIAGNOSIS — R7301 Impaired fasting glucose: Secondary | ICD-10-CM | POA: Diagnosis not present

## 2022-04-13 DIAGNOSIS — D500 Iron deficiency anemia secondary to blood loss (chronic): Secondary | ICD-10-CM | POA: Diagnosis not present

## 2022-04-14 DIAGNOSIS — I83811 Varicose veins of right lower extremities with pain: Secondary | ICD-10-CM | POA: Diagnosis not present

## 2022-04-15 DIAGNOSIS — D500 Iron deficiency anemia secondary to blood loss (chronic): Secondary | ICD-10-CM | POA: Diagnosis not present

## 2022-04-15 DIAGNOSIS — T148XXA Other injury of unspecified body region, initial encounter: Secondary | ICD-10-CM | POA: Diagnosis not present

## 2022-04-20 DIAGNOSIS — M79604 Pain in right leg: Secondary | ICD-10-CM | POA: Diagnosis not present

## 2022-04-20 DIAGNOSIS — M5416 Radiculopathy, lumbar region: Secondary | ICD-10-CM | POA: Diagnosis not present

## 2022-04-20 DIAGNOSIS — M79671 Pain in right foot: Secondary | ICD-10-CM | POA: Diagnosis not present

## 2022-04-20 DIAGNOSIS — M7631 Iliotibial band syndrome, right leg: Secondary | ICD-10-CM | POA: Diagnosis not present

## 2022-04-22 DIAGNOSIS — D500 Iron deficiency anemia secondary to blood loss (chronic): Secondary | ICD-10-CM | POA: Diagnosis not present

## 2022-04-29 DIAGNOSIS — D500 Iron deficiency anemia secondary to blood loss (chronic): Secondary | ICD-10-CM | POA: Diagnosis not present

## 2022-05-05 DIAGNOSIS — M79604 Pain in right leg: Secondary | ICD-10-CM | POA: Diagnosis not present

## 2022-05-05 DIAGNOSIS — M5416 Radiculopathy, lumbar region: Secondary | ICD-10-CM | POA: Diagnosis not present

## 2022-05-05 DIAGNOSIS — R2689 Other abnormalities of gait and mobility: Secondary | ICD-10-CM | POA: Diagnosis not present

## 2022-05-05 DIAGNOSIS — M7631 Iliotibial band syndrome, right leg: Secondary | ICD-10-CM | POA: Diagnosis not present

## 2022-05-16 DIAGNOSIS — F419 Anxiety disorder, unspecified: Secondary | ICD-10-CM | POA: Diagnosis not present

## 2022-05-21 DIAGNOSIS — F331 Major depressive disorder, recurrent, moderate: Secondary | ICD-10-CM | POA: Diagnosis not present

## 2022-05-24 ENCOUNTER — Emergency Department (HOSPITAL_BASED_OUTPATIENT_CLINIC_OR_DEPARTMENT_OTHER)
Admission: EM | Admit: 2022-05-24 | Discharge: 2022-05-24 | Disposition: A | Discharge status: Home / Self Care | Payer: Federal, State, Local not specified - PPO | Attending: Emergency Medicine | Admitting: Emergency Medicine

## 2022-05-24 ENCOUNTER — Other Ambulatory Visit: Payer: Self-pay

## 2022-05-24 ENCOUNTER — Emergency Department (HOSPITAL_BASED_OUTPATIENT_CLINIC_OR_DEPARTMENT_OTHER): Payer: Federal, State, Local not specified - PPO

## 2022-05-24 ENCOUNTER — Encounter (HOSPITAL_BASED_OUTPATIENT_CLINIC_OR_DEPARTMENT_OTHER): Payer: Self-pay | Admitting: Urology

## 2022-05-24 DIAGNOSIS — H538 Other visual disturbances: Secondary | ICD-10-CM | POA: Diagnosis not present

## 2022-05-24 DIAGNOSIS — R200 Anesthesia of skin: Secondary | ICD-10-CM | POA: Diagnosis not present

## 2022-05-24 DIAGNOSIS — R42 Dizziness and giddiness: Secondary | ICD-10-CM | POA: Insufficient documentation

## 2022-05-24 DIAGNOSIS — R519 Headache, unspecified: Secondary | ICD-10-CM | POA: Insufficient documentation

## 2022-05-24 HISTORY — DX: Anxiety disorder, unspecified: F41.9

## 2022-05-24 HISTORY — DX: Unspecified asthma, uncomplicated: J45.909

## 2022-05-24 HISTORY — DX: Anemia, unspecified: D64.9

## 2022-05-24 LAB — URINALYSIS, ROUTINE W REFLEX MICROSCOPIC
Bilirubin Urine: NEGATIVE
Glucose, UA: NEGATIVE mg/dL
Hgb urine dipstick: NEGATIVE
Ketones, ur: NEGATIVE mg/dL
Leukocytes,Ua: NEGATIVE
Nitrite: NEGATIVE
Protein, ur: NEGATIVE mg/dL
Specific Gravity, Urine: 1.02 (ref 1.005–1.030)
pH: 7 (ref 5.0–8.0)

## 2022-05-24 LAB — BASIC METABOLIC PANEL
Anion gap: 7 (ref 5–15)
BUN: 14 mg/dL (ref 6–20)
CO2: 26 mmol/L (ref 22–32)
Calcium: 8.8 mg/dL — ABNORMAL LOW (ref 8.9–10.3)
Chloride: 103 mmol/L (ref 98–111)
Creatinine, Ser: 0.87 mg/dL (ref 0.44–1.00)
GFR, Estimated: >60 mL/min (ref >60)
Glucose, Bld: 98 mg/dL (ref 70–99)
Potassium: 4.2 mmol/L (ref 3.5–5.1)
Sodium: 136 mmol/L (ref 135–145)

## 2022-05-24 LAB — CBC
HCT: 39.7 % (ref 36.0–46.0)
Hemoglobin: 12.6 g/dL (ref 12.0–15.0)
MCH: 26.1 pg (ref 26.0–34.0)
MCHC: 31.7 g/dL (ref 30.0–36.0)
MCV: 82.2 fL (ref 80.0–100.0)
Platelets: 334 10*3/uL (ref 150–400)
RBC: 4.83 MIL/uL (ref 3.87–5.11)
RDW: 17 % — ABNORMAL HIGH (ref 11.5–15.5)
WBC: 8.6 10*3/uL (ref 4.0–10.5)
nRBC: 0 % (ref 0.0–0.2)

## 2022-05-24 LAB — PREGNANCY, URINE: Preg Test, Ur: NEGATIVE

## 2022-05-24 LAB — CBG MONITORING, ED: Glucose-Capillary: 90 mg/dL (ref 70–99)

## 2022-05-24 NOTE — ED Notes (Signed)
D/c paperwork reviewed with pt.  All questions addressed prior to d/c. Pt ambulatory to ED exit without assistance. NAD.

## 2022-05-24 NOTE — ED Notes (Signed)
Patient is resting comfortably in her bed with a GCS of 15. Patient states she has blurred vision with left arm numbness starting last night. Pt states she also had some left sided jaw pain Saturday but has since subsided. She keeps a list of all of her symptoms on a piece of paper in her pocket. She thinks there is an issue with her nervous system.

## 2022-05-24 NOTE — Discharge Instructions (Addendum)
Please read and follow all provided instructions.  Your diagnoses today include:  1. Dizziness     Tests performed today include: Blood cell counts and electrolytes: Were normal, no problems noted CT scan: No problems today. EKG: No arrhythmias. Vital signs. See below for your results today.   Medications prescribed:  None  Take any prescribed medications only as directed.  Home care instructions:  Follow any educational materials contained in this packet.  BE VERY CAREFUL not to take multiple medicines containing Tylenol (also called acetaminophen). Doing so can lead to an overdose which can damage your liver and cause liver failure and possibly death.   Follow-up instructions: Please follow-up with your primary care provider and the neurologist referral.  Return instructions:  Please return to the Emergency Department if you experience worsening symptoms.  Return if you have weakness in your arms or legs, slurred speech, trouble walking or talking, confusion, or trouble with your balance.  Please return if you have any other emergent concerns.  Additional Information:  Your vital signs today were: BP (!) 114/58 (BP Location: Right Arm)   Pulse 77   Temp 98.6 F (37 C) (Oral)   Resp 17   Ht 5\' 3"  (1.6 m)   Wt 136.1 kg   LMP 05/14/2022 (Approximate)   SpO2 100%   BMI 53.14 kg/m  If your blood pressure (BP) was elevated above 135/85 this visit, please have this repeated by your doctor within one month. --------------

## 2022-05-24 NOTE — ED Triage Notes (Signed)
Left arm numbness and blurry vision intermittent x 1 week  States been dizzy x 10 days  Denies pain

## 2022-05-24 NOTE — ED Provider Notes (Signed)
[facility] EMERGENCY DEPARTMENT Provider Note   CSN: [id number] Arrival date & time: 05/24/22  1152     History  Chief Complaint  Patient presents with   Dizziness    Amy Kent is a 41 y.o. female.  Patient presents to the emergency department today for episode of blurry vision, no loss of vision, mainly on left eye with associated left arm numbness occurring while working today.  Patient states that she works at home for [employer].  Patient denies signs of stroke including: facial droop, slurred speech, aphasia, weakness in extremities, imbalance/trouble walking.  Her vision change was just blurry vision, no loss of vision.  Patient has had intermittent headaches, not currently present.  Also generalized dizziness.  She admits that the symptoms are anxiety provoking when they occur, prompting emergency department visit today.  Patient has a history of similar symptoms over the past year and a half.  She report having intermittent buzzing sensations in her body which migrate.  She has had work-up in the [facility] system which she recounts to me.  She states that she has seen PCP and neurology.  She has been told that her symptoms are due to anxiety and she has been placed on medications for this, but does not feel that they are very helpful.  She reports having MRI of the brain which was negative.  She also reports no previous work-up for seizures as far she can remember.  States that she has an upcoming nerve conduction study in September.  She is currently trying to establish care in the [facility] system.        Home Medications Prior to Admission medications   Not on File      Allergies    Patient has no known allergies.    Review of Systems   Review of Systems  Physical Exam Updated Vital Signs BP (!) 114/58 (BP Location: Right Arm)   Pulse 77   Temp 98.6 F (37 C) (Oral)   Resp 17   Ht 5\' 3"  (1.6 m)   Wt 136.1 kg   LMP 05/14/2022 (Approximate)    SpO2 100%   BMI 53.14 kg/m   Physical Exam Vitals and nursing note reviewed.  Constitutional:      Appearance: She is well-developed.  HENT:     Head: Normocephalic and atraumatic.     Right Ear: Tympanic membrane, ear canal and external ear normal.     Left Ear: Tympanic membrane, ear canal and external ear normal.     Ears:     Comments: Minimal air-fluid level noted behind left TM    Nose: Nose normal.     Mouth/Throat:     Pharynx: Uvula midline.  Eyes:     General: Lids are normal.     Extraocular Movements:     Right eye: No nystagmus.     Left eye: No nystagmus.     Conjunctiva/sclera: Conjunctivae normal.     Pupils: Pupils are equal, round, and reactive to light.  Cardiovascular:     Rate and Rhythm: Normal rate and regular rhythm.  Pulmonary:     Effort: Pulmonary effort is normal.     Breath sounds: Normal breath sounds.  Abdominal:     Palpations: Abdomen is soft.     Tenderness: There is no abdominal tenderness.  Musculoskeletal:     Cervical back: Normal range of motion and neck supple. No tenderness or bony tenderness.  Skin:    General: Skin is warm  and dry.  Neurological:     Mental Status: She is alert and oriented to person, place, and time.     GCS: GCS eye subscore is 4. GCS verbal subscore is 5. GCS motor subscore is 6.     Cranial Nerves: No cranial nerve deficit.     Sensory: No sensory deficit.     Motor: No weakness.     Coordination: Coordination normal.     Gait: Gait normal.     Comments: Sensation intact bilateral upper and lower extremities, however to light touch, patient states that it feels different on the left than the right.   Upper extremity myotomes tested bilaterally:  C5 Shoulder abduction 5/5 C6 Elbow flexion/wrist extension 5/5 C7 Elbow extension 5/5 C8 Finger flexion 5/5 T1 Finger abduction 5/5  Lower extremity myotomes tested bilaterally: L2 Hip flexion 5/5 L3 Knee extension 5/5 L4 Ankle dorsiflexion 5/5 S1 Ankle  plantar flexion 5/5   Psychiatric:        Attention and Perception: Attention normal.        Mood and Affect: Affect is tearful.        Speech: Speech normal.        Behavior: Behavior normal. Behavior is cooperative.        Thought Content: Thought content normal.     ED Results / Procedures / Treatments   Labs (all labs ordered are listed, but only abnormal results are displayed) Labs Reviewed  BASIC METABOLIC PANEL - Abnormal; Notable for the following components:      Result Value   Calcium 8.8 (*)    All other components within normal limits  CBC - Abnormal; Notable for the following components:   RDW 17.0 (*)    All other components within normal limits  URINALYSIS, ROUTINE W REFLEX MICROSCOPIC  PREGNANCY, URINE  CBG MONITORING, ED    ED ECG REPORT   Date: 05/24/2022  Rate: 76  Rhythm: normal sinus rhythm  QRS Axis: normal  Intervals: normal  ST/T Wave abnormalities: normal  Conduction Disutrbances:none  Narrative Interpretation:   Old EKG Reviewed: none available  I have personally reviewed the EKG tracing and agree with the computerized printout as noted.   Radiology CT Head Wo Contrast  Result Date: 05/24/2022 CLINICAL DATA:  Neuro deficit, acute stroke suspected. Headache. Blurry vision. Left arm numbness. EXAM: CT HEAD WITHOUT CONTRAST TECHNIQUE: Contiguous axial images were obtained from the base of the skull through the vertex without intravenous contrast. RADIATION DOSE REDUCTION: This exam was performed according to the departmental dose-optimization program which includes automated exposure control, adjustment of the mA and/or kV according to patient size and/or use of iterative reconstruction technique. COMPARISON:  None Available. FINDINGS: Brain: No evidence of acute infarction, hemorrhage, hydrocephalus, extra-axial collection or mass lesion/mass effect. Partially empty sella incidentally noted. Vascular: No hyperdense vessel or unexpected  calcification. Skull: Normal. Negative for fracture or focal lesion. Sinuses/Orbits: No acute finding. Other: None. IMPRESSION: No acute intracranial abnormality. MRI examination could be considered for further evaluation if clinically warranted. Electronically Signed   By: Larose Hires D.O.   On: 05/24/2022 13:14    Procedures Procedures    Medications Ordered in ED Medications - No data to display  ED Course/ Medical Decision Making/ A&P    Patient seen and examined. History obtained directly from patient.   Labs/EKG: Ordered CBC, BMP, UA, pregnancy.  Imaging: Ordered CT head.  Medications/Fluids: None ordered  Most recent vital signs reviewed and are as follows: BP Marland Kitchen)  114/58 (BP Location: Right Arm)   Pulse 77   Temp 98.6 F (37 C) (Oral)   Resp 17   Ht 5\' 3"  (1.6 m)   Wt 136.1 kg   LMP 05/14/2022 (Approximate)   SpO2 100%   BMI 53.14 kg/m   Initial impression: Paresthesias, blurry vision.  I had a pleasant discussion with the patient regarding her experiences over the past year and a half.  I think that she would likely benefit from a second opinion regarding her symptoms.  Discussed with patient that we will ensure no concerning changes today in regards to her labs and head CT.  If these are negative, anticipate discharged home.  I will assist in obtaining outpatient follow-up with [facility] and encourage patient to continue PCP follow-up.  3:36 PM Reassessment performed. Patient appears stable.  Labs personally reviewed and interpreted including: CBC with normal white blood cell count and red blood cell counts; BMP unremarkable; UA negative; pregnancy negative.  Imaging personally visualized and interpreted including: CT head, agree negative.  Reviewed pertinent lab work and imaging with patient at bedside. Questions answered.   Most current vital signs reviewed and are as follows: BP (!) 114/58 (BP Location: Right Arm)   Pulse 77   Temp 98.6 F (37 C)  (Oral)   Resp 17   Ht 5\' 3"  (1.6 m)   Wt 136.1 kg   LMP 05/14/2022 (Approximate)   SpO2 100%   BMI 53.14 kg/m   Plan: Discharge to home.   Prescriptions written for: None  Other home care instructions discussed: Rest, hydration.  Patient states that she is going to [place] next week and is looking forward to this trip.  ED return instructions discussed: Patient counseled to return if they have weakness in their arms or legs, slurred speech, trouble walking or talking, confusion, trouble with their balance, or if they have any other concerns. Patient verbalizes understanding and agrees with plan.   Follow-up instructions discussed: Ambulatory referral to [facility] neurology placed.  Patient encouraged to follow-up with PCP as planned to establish care.                          Medical Decision Making Amount and/or Complexity of Data Reviewed Labs: ordered. Radiology: ordered.   Patient with acute on chronic neuro symptoms.  It sounds like she has had symptoms similar to this over the past year and a half.  She has not gotten a good explanation for these.  No loss of vision, unilateral weakness, or other symptoms which are concerning for stroke.  No mass lesion or other acute findings, such as hemorrhage, on her head CT today.  Lab work is at baseline.  Do not suspect PE, ACS.  Patient will follow-up with neurology as outpatient.          Final Clinical Impression(s) / ED Diagnoses Final diagnoses:  Dizziness    Rx / DC Orders ED Discharge Orders          Ordered    Ambulatory referral to Neurology       Comments: An appointment is requested in approximately: 2 weeks   05/24/22 1534              [facility], 05/26/22 05/24/22 1542    Cordelia Poche, MD 05/29/22 2013

## 2022-05-24 NOTE — ED Notes (Signed)
Patient transported to CT 

## 2022-05-24 NOTE — ED Notes (Addendum)
Patient ambulatory to restroom without assistance. 

## 2022-05-26 ENCOUNTER — Encounter: Payer: Self-pay | Admitting: Neurology

## 2022-05-27 ENCOUNTER — Ambulatory Visit: Payer: Federal, State, Local not specified - PPO | Admitting: Family

## 2022-05-27 ENCOUNTER — Encounter: Payer: Self-pay | Admitting: Family

## 2022-05-27 VITALS — BP 114/78 | HR 77 | Temp 98.2°F | Ht 63.0 in | Wt 311.0 lb

## 2022-05-27 DIAGNOSIS — M792 Neuralgia and neuritis, unspecified: Secondary | ICD-10-CM | POA: Diagnosis not present

## 2022-05-27 DIAGNOSIS — F411 Generalized anxiety disorder: Secondary | ICD-10-CM | POA: Diagnosis not present

## 2022-05-27 DIAGNOSIS — M5137 Other intervertebral disc degeneration, lumbosacral region: Secondary | ICD-10-CM | POA: Insufficient documentation

## 2022-05-27 MED ORDER — VORTIOXETINE HBR 5 MG PO TABS: 5.0000 mg | ORAL_TABLET | Freq: Every day | ORAL | 0 refills | Status: DC

## 2022-05-27 MED ORDER — CLONAZEPAM 0.5 MG PO TABS: 0.5000 mg | ORAL_TABLET | Freq: Every day | ORAL | 1 refills | Status: DC | PRN

## 2022-05-27 MED ORDER — CLONAZEPAM 0.5 MG PO TABS: 0.5000 mg | ORAL_TABLET | Freq: Every day | ORAL | 0 refills | Status: DC | PRN

## 2022-05-27 NOTE — Assessment & Plan Note (Signed)
   Uncontrolled - Chronic  several meds tried & failed: Ativan was most helpful but still caused SOB as well as Hydroxyzine, SOB + drowsiness even at 10mg  dose, Trazodone > drowsiness during day, Lexapro >  anorgasmia.  sending Trintellix 5mg ,  pt also reports ADHD sx, advised on use & SE, pt given samples for 2 weeks, med may require PA  sending generic Klonopin to be taken only prn for severe anxiety/panic attacks  follow up in 1 month

## 2022-05-27 NOTE — Patient Instructions (Signed)
Welcome to [facility], It was a pleasure meeting you today!    As discussed, I have sent the medication for your anxiety to your pharmacy. The Trintellix is to be taken every day. Do not stop taking because you don't think it is working because I am giving you the lowest dose to be sure you tolerate it. We can increase the dose at your follow up appointment. I also have sent generic Klonopin to take only as needed for panic attacks or to prevent a panic attack, and ok to take on an airplane.   Please schedule a 1 month follow up visit today.    PLEASE NOTE: If you had any LAB tests please let us know if you have not heard back within a few days. You may see your results on MyChart before we have a chance to review them but we will give you a call once they are reviewed by [place]. If we ordered any REFERRALS today, please let us know if you have not heard from their office within the next week.  Let us know through MyChart if you are needing REFILLS, or have your pharmacy send [place] the request. You can also use MyChart to communicate with me or any office staff.   Please try these tips to maintain a healthy lifestyle:  Eat most of your calories during the day when you are active. Eliminate processed foods including packaged sweets (pies, cakes, cookies), reduce intake of potatoes, white bread, white pasta, and white rice. Look for whole grain options, oat flour or almond flour.  Each meal should contain half fruits/vegetables, one quarter protein, and one quarter carbs (no bigger than a computer mouse).  Cut down on sweet beverages. This includes juice, soda, and sweet tea. Also watch fruit intake, though this is a healthier sweet option, it still contains natural sugar! Limit to 3 servings daily.  Drink at least 1 8oz. glass of water with each meal and aim for at least 8 glasses per day  Exercise at least 150 minutes every week.

## 2022-05-27 NOTE — Assessment & Plan Note (Signed)
   Uncontrolled, chronic  seen in ER and [facility] in [place], no brain origin found per pt on brain scans (CT & MRI) and she is scheduled for nerve conduction study on 9/20.  pt has also scheduled appt w/[facility] to switch over care as she no longer lives in [place].  ordering lyme disease lab today - pt denies ever being tested for Lyme disease but does report being bitten by a tick around same time sx started, unable to see lab with a quick scan in chart hx

## 2022-05-27 NOTE — Progress Notes (Signed)
New Patient Office Visit  Subjective:  Patient ID: Amy Kent, female    DOB: [birth year]  Age: 41 y.o. MRN: [id number]  CC:  Chief Complaint  Patient presents with   Establish Care   Numbness    Pt c/o lower body numbness, buzzing sensation all day since Feb 2022. Pt states she feels light headed and left side of body goes numb when it flares up. Pt states there were events that occurred that led up to numbness; Headaches with the feeling of pens and needles. She has a Nerve test scheduled on Sept 20th.    Anxiety    Discuss medication, Has tried Ativan, hydroxyzine(SOB), Trazodone, Lexapro(Sexual dysfunction).     HPI Amy Kent presents for establishing care today.  Neuropathy: She describes symptoms starting in 11/2020 with numbness, burning, and tingling. Onset of symptoms was  last year . Symptoms are currently of moderate severity. Symptoms occur all day and last days. The patient denies lancinating pain, cramping, and squeezing. Symptoms are worse on the left side. She denies any autonomic symptoms. She reports feeling a buzzing, vibration sensation  from her waist down to both of her legs, feels hypersensitive to cold & heat sensations, and will have erythema at times, which took 2-3 weeks to go away the last time it happened. Also reports a viral illness with high fever prior to her symptoms starting. Previous treatment has included an ER eval and Neuro eval, and she has had head CT scan and MRI completed w/no anomalies found. Pt reports she is waiting to have a nerve conduction study done thru [facility] on 9/20 in [place]. She has contacted [facility] and has an appointment in October to transfer care.   Anxiety/Depression:  pt reports many year history of anxiety and has failed many meds. She thinks she is very sensitive to medications as even small doses cause side effects: Ativan was most helpful but still caused SOB as well as Hydroxyzine as well as drowsiness even at  10mg  dose, Trazodone- drowsiness during day, Lexapro - anorgasmia    05/27/2022    4:50 PM  Depression screen PHQ 2/9  Decreased Interest 1  Down, Depressed, Hopeless 1  PHQ - 2 Score 2  Altered sleeping 3  Tired, decreased energy 2  Change in appetite 0  Feeling bad or failure about yourself  0  Trouble concentrating 1  Moving slowly or fidgety/restless 0  Suicidal thoughts 0  PHQ-9 Score 8  Difficult doing work/chores Not difficult at all      05/27/2022    4:51 PM  GAD 7 : Generalized Anxiety Score  Nervous, Anxious, on Edge 3  Control/stop worrying 3  Worry too much - different things 3  Trouble relaxing 3  Restless 3  Easily annoyed or irritable 3  Afraid - awful might happen 3  Total GAD 7 Score 21  Anxiety Difficulty Somewhat difficult   Assessment & Plan:   Problem List Items Addressed This Visit       Other   Generalized anxiety disorder - Primary    Uncontrolled - Chronic several meds tried & failed: Ativan was most helpful but still caused SOB as well as Hydroxyzine, SOB + drowsiness even at 10mg  dose, Trazodone > drowsiness during day, Lexapro >  anorgasmia. sending Trintellix 5mg ,  pt also reports ADHD sx, advised on use & SE, pt given samples for 2 weeks, med may require PA sending generic Klonopin to be taken only prn for severe anxiety/panic attacks  follow up in 1 month      Relevant Medications   vortioxetine HBr (TRINTELLIX) 5 MG TABS tablet   clonazePAM (KLONOPIN) 0.5 MG tablet   Peripheral neuralgia    Uncontrolled, chronic seen in ER and [facility] in [place], no brain origin found per pt on brain scans (CT & MRI) and she is scheduled for nerve conduction study on 9/20. pt has also scheduled appt w/[facility] to switch over care as she no longer lives in [place]. ordering lyme disease lab today - pt denies ever being tested for Lyme disease but does report being bitten by a tick around same time sx started, unable to see lab with a quick scan in  chart hx      Relevant Orders   Lyme Disease Serology w/Reflex   B. burgdorfi antibodies    Subjective:    Outpatient Medications Prior to Visit  Medication Sig Dispense Refill   amphetamine-dextroamphetamine (ADDERALL) 20 MG tablet Take by mouth. (Patient not taking: Reported on 05/27/2022)     No facility-administered medications prior to visit.   Past Medical History:  Diagnosis Date   Anemia    Anxiety    Asthma    Depression    GERD (gastroesophageal reflux disease)    Sleep apnea    Mild   No past surgical history on file.  Objective:   Today's Vitals: BP 114/78 (BP Location: Left Arm, Patient Position: Sitting, Cuff Size: Large)   Pulse 77   Temp 98.2 F (36.8 C) (Temporal)   Ht 5\' 3"  (1.6 m)   Wt (!) 311 lb (141.1 kg)   LMP 05/14/2022 (Approximate)   SpO2 90%   BMI 55.09 kg/m   Physical Exam Vitals and nursing note reviewed.  Constitutional:      Appearance: Normal appearance. She is morbidly obese.  Cardiovascular:     Rate and Rhythm: Normal rate and regular rhythm.  Pulmonary:     Effort: Pulmonary effort is normal.     Breath sounds: Normal breath sounds.  Musculoskeletal:        General: Normal range of motion.  Skin:    General: Skin is warm and dry.  Neurological:     Mental Status: She is alert.  Psychiatric:        Mood and Affect: Mood normal.        Behavior: Behavior normal.     Meds ordered this encounter  Medications   DISCONTD: clonazePAM (KLONOPIN) 0.5 MG tablet    Sig: Take 1 tablet (0.5 mg total) by mouth daily as needed for anxiety.    Dispense:  20 tablet    Refill:  1    Order Specific Question:   Supervising Provider    Answer:   ANDY, CAMILLE L [[id number]]   vortioxetine HBr (TRINTELLIX) 5 MG TABS tablet    Sig: Take 1 tablet (5 mg total) by mouth daily.    Dispense:  30 tablet    Refill:  0    Pt has failed Lexapro and Trazodone    Order Specific Question:   Supervising Provider    Answer:   ANDY, CAMILLE L [[id number]]    clonazePAM (KLONOPIN) 0.5 MG tablet    Sig: Take 1 tablet (0.5 mg total) by mouth daily as needed for anxiety.    Dispense:  15 tablet    Refill:  0    Ignore previous prescription just sent!!    Order Specific Question:   Supervising Provider    Answer:  ANDY, CAMILLE L [[id number]]    Dulce Sellar, NP

## 2022-05-28 LAB — B. BURGDORFI ANTIBODIES: B burgdorferi Ab IgG+IgM: <0.9 index

## 2022-05-31 ENCOUNTER — Telehealth: Payer: Self-pay

## 2022-05-31 NOTE — Telephone Encounter (Signed)
Pt's insurance does cover medication, No prior auth needed.

## 2022-05-31 NOTE — Telephone Encounter (Signed)
-----   Message from Dulce Sellar, NP sent at 05/27/2022  4:37 PM EDT ----- Regarding: PA Please start a prior auth for pt Trintellix - she has failed Lexapro and Trazodone - she is getting therapy - let me know if anything needed on PA. Thanks!

## 2022-06-02 ENCOUNTER — Encounter (HOSPITAL_BASED_OUTPATIENT_CLINIC_OR_DEPARTMENT_OTHER): Payer: Self-pay

## 2022-06-02 ENCOUNTER — Other Ambulatory Visit: Payer: Self-pay

## 2022-06-02 ENCOUNTER — Emergency Department (HOSPITAL_BASED_OUTPATIENT_CLINIC_OR_DEPARTMENT_OTHER)
Admission: EM | Admit: 2022-06-02 | Discharge: 2022-06-02 | Disposition: A | Discharge status: Home / Self Care | Payer: Federal, State, Local not specified - PPO | Attending: Emergency Medicine | Admitting: Emergency Medicine

## 2022-06-02 DIAGNOSIS — M4726 Other spondylosis with radiculopathy, lumbar region: Secondary | ICD-10-CM | POA: Diagnosis not present

## 2022-06-02 DIAGNOSIS — R202 Paresthesia of skin: Secondary | ICD-10-CM | POA: Insufficient documentation

## 2022-06-02 DIAGNOSIS — R9431 Abnormal electrocardiogram [ECG] [EKG]: Secondary | ICD-10-CM | POA: Diagnosis not present

## 2022-06-02 DIAGNOSIS — M79604 Pain in right leg: Secondary | ICD-10-CM | POA: Diagnosis not present

## 2022-06-02 DIAGNOSIS — M4727 Other spondylosis with radiculopathy, lumbosacral region: Secondary | ICD-10-CM | POA: Diagnosis not present

## 2022-06-02 DIAGNOSIS — M5117 Intervertebral disc disorders with radiculopathy, lumbosacral region: Secondary | ICD-10-CM | POA: Diagnosis not present

## 2022-06-02 DIAGNOSIS — M5416 Radiculopathy, lumbar region: Secondary | ICD-10-CM | POA: Diagnosis not present

## 2022-06-02 MED ORDER — KETOROLAC TROMETHAMINE 15 MG/ML IJ SOLN
15.0000 mg | Freq: Once | INTRAMUSCULAR | Status: AC
  Administered 2022-06-02: 15 mg via INTRAMUSCULAR
  Filled 2022-06-02: qty 1

## 2022-06-02 NOTE — ED Triage Notes (Signed)
Pt c/o lower body "tingeling" 24 hours a day 7 days a week since February 2022. Pt reports her butt feels like "buzzing" that is running down her left leg. Pt reports she can not sleep and is leaving for [place] on Friday.

## 2022-06-02 NOTE — Discharge Instructions (Signed)
Please follow-up with your doctors in the office.  Have provided information for you to follow-up with the neurosurgery group in our system if you so choose.

## 2022-06-02 NOTE — ED Provider Notes (Signed)
[facility] EMERGENCY DEPARTMENT Provider Note   CSN: [id number] Arrival date & time: 06/02/22  4481     History  Chief Complaint  Patient presents with   Numbness    Amy Kent is a 41 y.o. female.  41 yo F with a chief complaint of tingling.  This is mostly to her legs.  Is been going on for 2-1/2 years.  She is seen multiple providers but does not feel like they are taking her seriously.  She has seen spine, PCP.  Has follow-up appointment with neurologist for an EMG study in a couple weeks.  She felt the symptoms were worse this evening she was having trouble sleeping and so came here for evaluation.        Home Medications Prior to Admission medications   Medication Sig Start Date End Date Taking? Authorizing Provider  amphetamine-dextroamphetamine (ADDERALL) 20 MG tablet Take by mouth. Patient not taking: Reported on 05/27/2022 01/21/22   [provider]  clonazePAM (KLONOPIN) 0.5 MG tablet Take 1 tablet (0.5 mg total) by mouth daily as needed for anxiety. 05/27/22   Dulce Sellar, NP  vortioxetine HBr (TRINTELLIX) 5 MG TABS tablet Take 1 tablet (5 mg total) by mouth daily. 05/27/22   Dulce Sellar, NP      Allergies    Patient has no known allergies.    Review of Systems   Review of Systems  Physical Exam Updated Vital Signs BP 131/83   Pulse 84   Temp 98.1 F (36.7 C) (Oral)   Resp 17   Ht 5\' 3"  (1.6 m)   Wt (!) 138.3 kg   LMP 05/14/2022 (Approximate)   SpO2 95%   BMI 54.03 kg/m  Physical Exam Vitals and nursing note reviewed.  Constitutional:      General: She is not in acute distress.    Appearance: She is well-developed. She is not diaphoretic.  HENT:     Head: Normocephalic and atraumatic.  Eyes:     Pupils: Pupils are equal, round, and reactive to light.  Cardiovascular:     Rate and Rhythm: Normal rate and regular rhythm.     Heart sounds: No murmur heard.    No friction rub. No gallop.  Pulmonary:      Effort: Pulmonary effort is normal.     Breath sounds: No wheezing or rales.  Abdominal:     General: There is no distension.     Palpations: Abdomen is soft.     Tenderness: There is no abdominal tenderness.  Musculoskeletal:        General: No tenderness.     Cervical back: Normal range of motion and neck supple.     Comments: PMS intact bilateral lower extremities.  Able to ambulate without issue.  Reflexes 2+ and equal.  No clonus.  Skin:    General: Skin is warm and dry.  Neurological:     Mental Status: She is alert and oriented to person, place, and time.  Psychiatric:        Behavior: Behavior normal.     ED Results / Procedures / Treatments   Labs (all labs ordered are listed, but only abnormal results are displayed) Labs Reviewed - No data to display  EKG EKG Interpretation  Date/Time:  Thursday June 02 2022 03:31:35 EDT Ventricular Rate:  76 PR Interval:  156 QRS Duration: 93 QT Interval:  381 QTC Calculation: 429 R Axis:   80 Text Interpretation: Sinus rhythm Anteroseptal infarct, age indeterminate No significant  change since last tracing Confirmed by Melene Plan [id number]) on 06/02/2022 4:13:38 AM  Radiology No results found.  Procedures Procedures    Medications Ordered in ED Medications  ketorolac (TORADOL) 15 MG/ML injection 15 mg (15 mg Intramuscular Given 06/02/22 0351)    ED Course/ Medical Decision Making/ A&P                           Medical Decision Making Amount and/or Complexity of Data Reviewed ECG/medicine tests: ordered.  Risk Prescription drug management.   41 yo F with 2 and half years of paresthesias to bilateral lower extremities.  On my record review the patient is seen multiple providers for this.  Had an MRI of her low back and an MRI of the brain.  Has been seen by orthopedics PCP visit this month.  She is here requesting a repeat MRI, thinks that something may have changed in her back.  She has a benign exam.  Atraumatic.  On  further discussion not taking any of her medications at home.  I initially thought about checking electrolytes though it appears the patient has had multiple rounds of normal blood work including this month that do not describe her symptoms.  We hold off on extensive work-up here.  She is requesting a shot of Toradol so we will give her that.  We will have her follow-up with spine in our system if she so chooses.   4:14 AM:  I have discussed the diagnosis/risks/treatment options with the patient.  Evaluation and diagnostic testing in the emergency department does not suggest an emergent condition requiring admission or immediate intervention beyond what has been performed at this time.  They will follow up with  PCP, neurosurgery. We also discussed returning to the ED immediately if new or worsening sx occur. We discussed the sx which are most concerning (e.g., sudden worsening pain, fever, inability to tolerate by mouth) that necessitate immediate return. Medications administered to the patient during their visit and any new prescriptions provided to the patient are listed below.  Medications given during this visit Medications  ketorolac (TORADOL) 15 MG/ML injection 15 mg (15 mg Intramuscular Given 06/02/22 0351)     The patient appears reasonably screen and/or stabilized for discharge and I doubt any other medical condition or other [facility] requiring further screening, evaluation, or treatment in the ED at this time prior to discharge.           Final Clinical Impression(s) / ED Diagnoses Final diagnoses:  Paresthesia    Rx / DC Orders ED Discharge Orders     None         Melene Plan, DO 06/02/22 [phone]

## 2022-06-09 DIAGNOSIS — R0981 Nasal congestion: Secondary | ICD-10-CM | POA: Diagnosis not present

## 2022-06-09 DIAGNOSIS — J029 Acute pharyngitis, unspecified: Secondary | ICD-10-CM | POA: Diagnosis not present

## 2022-06-09 DIAGNOSIS — K2100 Gastro-esophageal reflux disease with esophagitis, without bleeding: Secondary | ICD-10-CM | POA: Diagnosis not present

## 2022-06-09 DIAGNOSIS — U071 COVID-19: Secondary | ICD-10-CM | POA: Diagnosis not present

## 2022-06-09 NOTE — Progress Notes (Signed)
Sorry for the delay in reviewing your results. I was waiting for he 2nd lyme disease test result and finally told that the 2 tests ordered are exactly the same. You are negative for any past or current lyme disease infection. Take care.

## 2022-06-15 ENCOUNTER — Encounter: Payer: Self-pay | Admitting: Family

## 2022-06-17 NOTE — Telephone Encounter (Signed)
Returned patient's call in regards to Adderall refill. I was able to read message below and pt verbalized understanding. Pt has been scheduled for 06/20/22 @ 3pm.

## 2022-06-19 NOTE — Progress Notes (Unsigned)
   Patient ID: Amy Kent, female    DOB: 03-Nov-1980, 41 y.o.   MRN: [id number]  No chief complaint on file.   HPI: ADHD f/u: Medications helping target goals: Adderall  Regimen: Medication side effects/concerns:  Weight: Sleep: Mood changes: Tics: Blood pressure, Weight, Pulse, Behavior reviewed: wnl        Assessment & Plan:   Problem List Items Addressed This Visit   None   Subjective:    Outpatient Medications Prior to Visit  Medication Sig Dispense Refill  . amphetamine-dextroamphetamine (ADDERALL) 20 MG tablet Take by mouth. (Patient not taking: Reported on 05/27/2022)    . clonazePAM (KLONOPIN) 0.5 MG tablet Take 1 tablet (0.5 mg total) by mouth daily as needed for anxiety. 15 tablet 0  . vortioxetine HBr (TRINTELLIX) 5 MG TABS tablet Take 1 tablet (5 mg total) by mouth daily. 30 tablet 0   No facility-administered medications prior to visit.   Past Medical History:  Diagnosis Date  . Anemia   . Anxiety   . Asthma   . Depression   . GERD (gastroesophageal reflux disease)   . Sleep apnea    Mild   No past surgical history on file. No Known Allergies    Objective:    Physical Exam Vitals and nursing note reviewed.  Constitutional:      Appearance: Normal appearance. She is obese.  Cardiovascular:     Rate and Rhythm: Normal rate and regular rhythm.  Pulmonary:     Effort: Pulmonary effort is normal.     Breath sounds: Normal breath sounds.  Musculoskeletal:        General: Normal range of motion.  Skin:    General: Skin is warm and dry.  Neurological:     Mental Status: She is alert.  Psychiatric:        Mood and Affect: Mood normal.        Behavior: Behavior normal.  LMP 05/14/2022 (Approximate)  Wt Readings from Last 3 Encounters:  06/02/22 (!) 305 lb (138.3 kg)  05/27/22 (!) 311 lb (141.1 kg)  05/24/22 300 lb (136.1 kg)       Dulce Sellar, NP

## 2022-06-20 ENCOUNTER — Encounter: Payer: Self-pay | Admitting: Family

## 2022-06-20 ENCOUNTER — Ambulatory Visit: Payer: Federal, State, Local not specified - PPO | Admitting: Family

## 2022-06-20 VITALS — BP 115/80 | HR 90 | Temp 97.7°F | Ht 63.0 in | Wt 318.0 lb

## 2022-06-20 DIAGNOSIS — F908 Attention-deficit hyperactivity disorder, other type: Secondary | ICD-10-CM

## 2022-06-20 DIAGNOSIS — R110 Nausea: Secondary | ICD-10-CM

## 2022-06-20 DIAGNOSIS — R635 Abnormal weight gain: Secondary | ICD-10-CM | POA: Diagnosis not present

## 2022-06-20 DIAGNOSIS — K219 Gastro-esophageal reflux disease without esophagitis: Secondary | ICD-10-CM

## 2022-06-20 MED ORDER — ONDANSETRON HCL 4 MG PO TABS: 4.0000 mg | ORAL_TABLET | Freq: Three times a day (TID) | ORAL | 0 refills | Status: AC | PRN

## 2022-06-20 MED ORDER — AMPHETAMINE-DEXTROAMPHETAMINE 20 MG PO TABS: 20.0000 mg | ORAL_TABLET | Freq: Every day | ORAL | 0 refills | Status: DC

## 2022-06-20 MED ORDER — SAXENDA 18 MG/3ML ~~LOC~~ SOPN: 0.6000 mg | PEN_INJECTOR | Freq: Every day | SUBCUTANEOUS | 0 refills | Status: DC

## 2022-06-20 MED ORDER — OMEPRAZOLE 20 MG PO CPDR: 20.0000 mg | DELAYED_RELEASE_CAPSULE | Freq: Every day | ORAL | 1 refills | Status: DC

## 2022-06-20 NOTE — Patient Instructions (Addendum)
It was very nice to see you today!   I have sent over Adderall to your pharmacy. I have refilled your Omeprazole to help reduce any reflux or heartburn symptoms with restarting the Saxenda. Go ahead and finish the 40mg  pills you have at home, then start the 20mg  pills daily. Restart the Saxenda at 0.6mg  daily. If no side effects, you can increase to 1.2mg  daily and stay at this dose until I see you again. Be careful also to avoid constipation with the Saxenda - drink at least 2 liters of water daily, can take fiber supplements (Benefiber or Metamucil powder daily). You should have a soft, easy BM every day.  Schedule a 3 month follow up or sooner if needed.        PLEASE NOTE:  If you had any lab tests please let know if you have not heard back within a few days. You may see your results on MyChart before we have a chance to review them but we will give you a call once they are reviewed by . If we ordered any referrals today, please let us know if you have not heard from their office within the next week.

## 2022-06-21 NOTE — Assessment & Plan Note (Signed)
   chronic  pt on Adderall 20mg  bid  PDMP checked & verified, contract signed & scanned  sending refill  f/u in 3 mos

## 2022-06-21 NOTE — Assessment & Plan Note (Signed)
   chronic  Ozempic caused bad sx  still has box of Saxenda pens at home, advised to restart at 0.6mg  dose qd x 2w, then increase to 1.2mg  if tolerated  advised on other wt loss strategies, good eating habits, increasing daily exercise, drinking 2L water qd  f/u in 1 month

## 2022-06-21 NOTE — Assessment & Plan Note (Signed)
   chronic  reports having GI cocktail when sx are really bad & works well  has Prilosec 40mg  at home, but had stopped taking when sx improved  advised to restart 40mg  dose x 2w, then switch to 20mg , sending RX today  refilling Zofran 4mg  q8h prn  f/u prn

## 2022-06-24 DIAGNOSIS — D500 Iron deficiency anemia secondary to blood loss (chronic): Secondary | ICD-10-CM | POA: Diagnosis not present

## 2022-06-24 DIAGNOSIS — D649 Anemia, unspecified: Secondary | ICD-10-CM | POA: Diagnosis not present

## 2022-06-29 ENCOUNTER — Ambulatory Visit: Payer: Federal, State, Local not specified - PPO | Admitting: Family

## 2022-06-30 DIAGNOSIS — F411 Generalized anxiety disorder: Secondary | ICD-10-CM | POA: Diagnosis not present

## 2022-06-30 DIAGNOSIS — R202 Paresthesia of skin: Secondary | ICD-10-CM | POA: Diagnosis not present

## 2022-07-01 ENCOUNTER — Ambulatory Visit: Payer: Federal, State, Local not specified - PPO | Admitting: Family

## 2022-07-01 ENCOUNTER — Encounter: Payer: Self-pay | Admitting: Family

## 2022-07-01 NOTE — Telephone Encounter (Signed)
PA approved today on 07/01/2022.Key: [id number]  Amphetamine-Dextroamphetamine 20MG  tablets  Mychart message sent to pt.

## 2022-07-04 ENCOUNTER — Encounter: Payer: Self-pay | Admitting: *Deleted

## 2022-07-06 DIAGNOSIS — M47814 Spondylosis without myelopathy or radiculopathy, thoracic region: Secondary | ICD-10-CM | POA: Diagnosis not present

## 2022-07-06 DIAGNOSIS — M5459 Other low back pain: Secondary | ICD-10-CM | POA: Diagnosis not present

## 2022-07-06 DIAGNOSIS — M5136 Other intervertebral disc degeneration, lumbar region: Secondary | ICD-10-CM | POA: Diagnosis not present

## 2022-07-06 DIAGNOSIS — M47816 Spondylosis without myelopathy or radiculopathy, lumbar region: Secondary | ICD-10-CM | POA: Diagnosis not present

## 2022-07-08 DIAGNOSIS — B977 Papillomavirus as the cause of diseases classified elsewhere: Secondary | ICD-10-CM | POA: Diagnosis not present

## 2022-07-08 DIAGNOSIS — F411 Generalized anxiety disorder: Secondary | ICD-10-CM | POA: Diagnosis not present

## 2022-07-08 DIAGNOSIS — D500 Iron deficiency anemia secondary to blood loss (chronic): Secondary | ICD-10-CM | POA: Diagnosis not present

## 2022-07-08 DIAGNOSIS — E559 Vitamin D deficiency, unspecified: Secondary | ICD-10-CM | POA: Diagnosis not present

## 2022-07-08 DIAGNOSIS — J4530 Mild persistent asthma, uncomplicated: Secondary | ICD-10-CM | POA: Diagnosis not present

## 2022-07-08 DIAGNOSIS — D649 Anemia, unspecified: Secondary | ICD-10-CM | POA: Diagnosis not present

## 2022-07-08 DIAGNOSIS — E236 Other disorders of pituitary gland: Secondary | ICD-10-CM | POA: Diagnosis not present

## 2022-07-11 ENCOUNTER — Telehealth: Payer: Self-pay | Admitting: Family

## 2022-07-11 DIAGNOSIS — F411 Generalized anxiety disorder: Secondary | ICD-10-CM | POA: Diagnosis not present

## 2022-07-11 NOTE — Telephone Encounter (Signed)
Pt states: -currently on tele-work accommodations at work. -Currently scheduled with PCP 10/04 -she will cancel appointment if PCP team is unable to grant request.  Pt asks: -Will PCP team write letter for work "reasonable accommodation" to work from home permanently.   Pt requests: -call back with determination about letter.

## 2022-07-12 NOTE — Telephone Encounter (Signed)
I'm not sure why she needs this, I have no notes regarding problems w/work r/t her anxiety/depression. She is welcome to keep appointment to talk more about as I need more info before deciding. Also she should ask if her work accommodations require an Event organiser form?  Thanks.

## 2022-07-12 NOTE — Telephone Encounter (Signed)
Patient stated that FMLA form is not needed. She stated that she just needed a letter stating that she could stay working from home. Patient will keep appointment tomorrow to discuss something else. Patient stated that she was able to get a letter from  her therapist.

## 2022-07-13 ENCOUNTER — Encounter: Payer: Self-pay | Admitting: Family

## 2022-07-13 ENCOUNTER — Other Ambulatory Visit: Payer: Self-pay

## 2022-07-13 ENCOUNTER — Ambulatory Visit: Payer: Federal, State, Local not specified - PPO | Admitting: Family

## 2022-07-13 ENCOUNTER — Encounter (HOSPITAL_BASED_OUTPATIENT_CLINIC_OR_DEPARTMENT_OTHER): Payer: Self-pay | Admitting: Emergency Medicine

## 2022-07-13 VITALS — BP 136/84 | HR 89 | Temp 97.7°F | Ht 63.0 in | Wt 312.1 lb

## 2022-07-13 DIAGNOSIS — F411 Generalized anxiety disorder: Secondary | ICD-10-CM

## 2022-07-13 DIAGNOSIS — J45901 Unspecified asthma with (acute) exacerbation: Secondary | ICD-10-CM

## 2022-07-13 DIAGNOSIS — J45909 Unspecified asthma, uncomplicated: Secondary | ICD-10-CM | POA: Diagnosis not present

## 2022-07-13 DIAGNOSIS — Z23 Encounter for immunization: Secondary | ICD-10-CM

## 2022-07-13 DIAGNOSIS — M792 Neuralgia and neuritis, unspecified: Secondary | ICD-10-CM | POA: Diagnosis not present

## 2022-07-13 DIAGNOSIS — Z79899 Other long term (current) drug therapy: Secondary | ICD-10-CM | POA: Insufficient documentation

## 2022-07-13 DIAGNOSIS — R0789 Other chest pain: Secondary | ICD-10-CM | POA: Diagnosis not present

## 2022-07-13 DIAGNOSIS — J9801 Acute bronchospasm: Secondary | ICD-10-CM | POA: Insufficient documentation

## 2022-07-13 DIAGNOSIS — Z87891 Personal history of nicotine dependence: Secondary | ICD-10-CM | POA: Diagnosis not present

## 2022-07-13 HISTORY — DX: Unspecified asthma with (acute) exacerbation: J45.901

## 2022-07-13 LAB — URINALYSIS, ROUTINE W REFLEX MICROSCOPIC
Bilirubin Urine: NEGATIVE
Glucose, UA: NEGATIVE mg/dL
Hgb urine dipstick: NEGATIVE
Ketones, ur: NEGATIVE mg/dL
Leukocytes,Ua: NEGATIVE
Nitrite: NEGATIVE
Protein, ur: 30 mg/dL — AB
Specific Gravity, Urine: 1.025 (ref 1.005–1.030)
pH: 7 (ref 5.0–8.0)

## 2022-07-13 LAB — CBC
HCT: 39.3 % (ref 36.0–46.0)
Hemoglobin: 12.6 g/dL (ref 12.0–15.0)
MCH: 26.4 pg (ref 26.0–34.0)
MCHC: 32.1 g/dL (ref 30.0–36.0)
MCV: 82.4 fL (ref 80.0–100.0)
Platelets: 300 10*3/uL (ref 150–400)
RBC: 4.77 MIL/uL (ref 3.87–5.11)
RDW: 15.9 % — ABNORMAL HIGH (ref 11.5–15.5)
WBC: 11.4 10*3/uL — ABNORMAL HIGH (ref 4.0–10.5)
nRBC: 0 % (ref 0.0–0.2)

## 2022-07-13 LAB — URINALYSIS, MICROSCOPIC (REFLEX)

## 2022-07-13 LAB — BASIC METABOLIC PANEL
Anion gap: 7 (ref 5–15)
BUN: 13 mg/dL (ref 6–20)
CO2: 25 mmol/L (ref 22–32)
Calcium: 8.7 mg/dL — ABNORMAL LOW (ref 8.9–10.3)
Chloride: 105 mmol/L (ref 98–111)
Creatinine, Ser: 0.89 mg/dL (ref 0.44–1.00)
GFR, Estimated: >60 mL/min (ref >60)
Glucose, Bld: 97 mg/dL (ref 70–99)
Potassium: 3.8 mmol/L (ref 3.5–5.1)
Sodium: 137 mmol/L (ref 135–145)

## 2022-07-13 LAB — CBG MONITORING, ED: Glucose-Capillary: 93 mg/dL (ref 70–99)

## 2022-07-13 LAB — PREGNANCY, URINE: Preg Test, Ur: NEGATIVE

## 2022-07-13 MED ORDER — PREGABALIN 25 MG PO CAPS: 25.0000 mg | ORAL_CAPSULE | Freq: Two times a day (BID) | ORAL | 1 refills | Status: DC

## 2022-07-13 MED ORDER — BUDESONIDE 0.5 MG/2ML IN SUSP: 0.5000 mg | Freq: Two times a day (BID) | RESPIRATORY_TRACT | 0 refills | Status: DC

## 2022-07-13 MED ORDER — BUDESONIDE-FORMOTEROL FUMARATE 80-4.5 MCG/ACT IN AERO: 2.0000 | INHALATION_SPRAY | Freq: Two times a day (BID) | RESPIRATORY_TRACT | 2 refills | Status: DC

## 2022-07-13 NOTE — Assessment & Plan Note (Signed)
.   Uncontrolled - Chronic . Patient reports just starting Trintellix 5mg  samples 3 days ago, so far tolerating well, reports from dizziness today but this started prior to starting the Trintellix. . Believe her dizziness is related to her anxiety, advised patient continue to take Trintellix every day, emphasizing the importance of this to control her symptoms. . follow up in 1 month

## 2022-07-13 NOTE — Assessment & Plan Note (Signed)
   Chronic  Patient has used a SABA & Symbicort in the past, reports albuterol made her too jittery  Reports having a cough with wheezing for over a week  Has a nebulizer but no medicine, reports using budesonide in the past  Sending refill of nebulizer med, and low-dose Symbicort, 2 puffs twice a day  Advised to restart her Singulair  Follow-up in 1 month

## 2022-07-13 NOTE — Progress Notes (Unsigned)
Patient ID: Amy Kent, female    DOB: [birth year], 41 y.o.   MRN: [id number]  Chief Complaint  Patient presents with   Pain    Pt c/o random pains all over her body lasting for about 20 seconds then stop. Present for about a couple of months. Sharp, throbbing pain then eases off but comes back in another place on her body.    Dizziness    Pt c/o Lightheadedness at random times  for a few days with blurred vision.    Cough    with wheezing    HPI: Wheezing/cough:  reports she has been coughing and wheezing for a week, reports having a lot of phlegm, but difficult to cough up at times, denies any fever or sinus sx, reports having in past, states she was never told she had Asthma, but she used to be on Symbicort inhaler and Albuterol, and also has Singulair but she forgets to take. Neuropathy: She describes symptoms starting in 11/2020 with numbness, burning, and tingling. Onset of symptoms was last year. Symptoms are currently of moderate severity. She still reports feeling a buzzing, vibration sensation  from her waist down to both of her legs, feels hypersensitive to cold & heat sensations, but now she is having random, throbbing pain in different joints/muscles of her body lasting 20 sec and stops, but then starts in another part of her body. She was seen by [facility] in [place] and they did an MRI which indicates mild degeneration in her lumbar spine w/radiculopathy. Anxiety/Depression:  pt reports many year history of anxiety and has failed many meds. She thinks she is very sensitive to medications as even small doses cause side effects: Ativan was most helpful but still caused SOB as well as Hydroxyzine as well with drowsiness even at 10mg  dose, Trazodone- drowsiness during day, Lexapro - anorgasmia. Started Trintellix last visit but patient just started taking a few days ago the samples that were provided, so far tolerating.  Assessment & Plan:   Problem List Items Addressed This Visit        Respiratory   Moderate asthma with acute exacerbation    Chronic Patient has used a SABA & Symbicort in the past, reports albuterol made her too jittery Reports having a cough with wheezing for over a week Has a nebulizer but no medicine, reports using budesonide in the past Sending refill of nebulizer med, and low-dose Symbicort, 2 puffs twice a day Advised to restart her Singulair Follow-up in 1 month      Relevant Medications   budesonide-formoterol (SYMBICORT) 80-4.5 MCG/ACT inhaler   budesonide (PULMICORT) 0.5 MG/2ML nebulizer solution     Other   Generalized anxiety disorder    Uncontrolled - Chronic Patient reports just starting Trintellix 5mg  samples 3 days ago, so far tolerating well, reports from dizziness today but this started prior to starting the Trintellix. Believe her dizziness is related to her anxiety, advised patient continue to take Trintellix every day, emphasizing the importance of this to control her symptoms. 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 Now she is having random off-and-on pains throughout her body Also continues to complain of the buzzing/vibrations in her legs Given gabapentin 300 mg 3 times daily by neuro provider, states she did not take anymore after the first pill made her feel drowsy/sick Discussed restarting at a lower dose versus Lyrica, patient  would like to try Lyrica Sending 25 mg twice daily, advised on use and side effects, increase to 2 pills twice a day if tolerated Follow-up in 1 month      Relevant Medications   pregabalin (LYRICA) 25 MG capsule   Other Visit Diagnoses     Need for immunization against influenza    -  Primary   Relevant Orders   Flu Vaccine QUAD 6+ mos PF IM (Fluarix Quad PF)       Subjective:    Outpatient Medications Prior to Visit  Medication Sig Dispense Refill    amphetamine-dextroamphetamine (ADDERALL) 20 MG tablet Take 1 tablet (20 mg total) by mouth daily before breakfast. 30 tablet 0   [START ON 07/20/2022] amphetamine-dextroamphetamine (ADDERALL) 20 MG tablet Take 1 tablet (20 mg total) by mouth daily before breakfast. 30 tablet 0   [START ON 08/19/2022] amphetamine-dextroamphetamine (ADDERALL) 20 MG tablet Take 1 tablet (20 mg total) by mouth daily before breakfast. 30 tablet 0   clonazePAM (KLONOPIN) 0.5 MG tablet Take 1 tablet (0.5 mg total) by mouth daily as needed for anxiety. 15 tablet 0   cyclobenzaprine (FLEXERIL) 10 MG tablet Take 10 mg by mouth as needed for muscle spasms.     diclofenac (VOLTAREN) 75 MG EC tablet Take 75 mg by mouth as needed for mild pain.     ibuprofen (ADVIL) 800 MG tablet Take 800 mg by mouth every 6 (six) hours as needed.     Liraglutide -Weight Management (SAXENDA) 18 MG/3ML SOPN Inject 0.6 mg into the skin daily. 3 mL 0   montelukast (SINGULAIR) 10 MG tablet Take 10 mg by mouth at bedtime.     omeprazole (PRILOSEC) 20 MG capsule Take 1 capsule (20 mg total) by mouth daily. 90 capsule 1   ondansetron (ZOFRAN) 4 MG tablet Take 1 tablet (4 mg total) by mouth every 8 (eight) hours as needed for nausea or vomiting. 20 tablet 0   Vitamin D, Ergocalciferol, (DRISDOL) 1.25 MG (50000 UNIT) CAPS capsule Take 50,000 Units by mouth every 7 (seven) days.     vortioxetine HBr (TRINTELLIX) 5 MG TABS tablet Take 1 tablet (5 mg total) by mouth daily. 30 tablet 0   GI Cocktail (alum & mag hydroxide, lidocaine, dicyclomine) oral mixture Take 480 mLs by mouth 2 (two) times daily as needed.     No facility-administered medications prior to visit.   Past Medical History:  Diagnosis Date   Anemia    Anxiety    Asthma    Attention deficit disorder with hyperactivity 08/08/2014   Atypical chest pain 04/05/2011   Bruising 03/24/2022   Chronic anxiety 12/08/2020   DDD (degenerative disc disease), lumbosacral    Depression    DOE (dyspnea  on exertion) 03/21/2022   GERD (gastroesophageal reflux disease)    Insomnia 01/05/2021   Lumbar back pain 10/17/2014   Formatting of this note might be different from the original. recurrent   Other acne 10/20/2008   Formatting of this note might be different from the original. Acne   Palpitations 03/21/2022   Sleep apnea    Mild   History reviewed. No pertinent surgical history. No Known Allergies    Objective:    Physical Exam Vitals and nursing note reviewed.  Constitutional:      Appearance: Normal appearance. She is morbidly obese.  Cardiovascular:     Rate and Rhythm: Normal rate and regular rhythm.  Pulmonary:     Effort: Pulmonary effort is normal. No respiratory distress.  Breath sounds: Examination of the right-upper field reveals wheezing. Examination of the left-upper field reveals wheezing. Examination of the right-middle field reveals wheezing. Examination of the left-middle field reveals wheezing. Wheezing (expiratory) present.  Musculoskeletal:        General: Normal range of motion.  Skin:    General: Skin is warm and dry.  Neurological:     Mental Status: She is alert.  Psychiatric:        Mood and Affect: Mood normal.        Behavior: Behavior normal.    BP 136/84 (BP Location: Left Arm, Patient Position: Sitting, Cuff Size: Large)   Pulse 89   Temp 97.7 F (36.5 C) (Temporal)   Ht 5\' 3"  (1.6 m)   Wt (!) 312 lb 2 oz (141.6 kg)   LMP 06/18/2022 (Exact Date)   SpO2 93%   BMI 55.29 kg/m  Wt Readings from Last 3 Encounters:  07/13/22 (!) 312 lb 2 oz (141.6 kg)  06/20/22 (!) 318 lb (144.2 kg)  06/02/22 (!) 305 lb (138.3 kg)       06/04/22, NP

## 2022-07-13 NOTE — ED Triage Notes (Signed)
Pt states has burning and throbbing pain under left breast. X 2 hours early had left foot numbness. Was seen by PCP and given Rx for lyrica but was not in stock. Also complains of dizziness.

## 2022-07-13 NOTE — Patient Instructions (Addendum)
It was very nice to see you today!   I have sent over Budesonide bullets for your nebulizer - use this at bedtime. I also sent over generic Symbicort which is Budesonide-albuterol (long acting) to use in the morning. Once your cough and wheezing is better controlled, then JUST use the Symbicort inhaler every day, twice a day, 2 puffs & stop the nebulizer.  Drink at least 2 liters of water every day.  Keep taking the Trintellix & pick up your RX from the pharmacy (Vortioxetine is the generic).   I also sent over Pregabalin to help your all over pain and vibrating, tingling sensations, start with 1 pill twice a day for a few days, then increase to 2 pills twice a day.  Schedule a 1 month follow up visit for all the above.     PLEASE NOTE:  If you had any lab tests please let us know if you have not heard back within a few days. You may see your results on MyChart before we have a chance to review them but we will give you a call once they are reviewed by [place]. If we ordered any referrals today, please let us know if you have not heard from their office within the next week.

## 2022-07-13 NOTE — Assessment & Plan Note (Signed)
   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

## 2022-07-14 ENCOUNTER — Telehealth: Payer: Self-pay | Admitting: Family

## 2022-07-14 ENCOUNTER — Encounter: Payer: Self-pay | Admitting: Family

## 2022-07-14 ENCOUNTER — Emergency Department (HOSPITAL_BASED_OUTPATIENT_CLINIC_OR_DEPARTMENT_OTHER)
Admission: EM | Admit: 2022-07-14 | Discharge: 2022-07-14 | Disposition: A | Discharge status: Home / Self Care | Payer: Federal, State, Local not specified - PPO | Attending: Emergency Medicine | Admitting: Emergency Medicine

## 2022-07-14 DIAGNOSIS — R0789 Other chest pain: Secondary | ICD-10-CM

## 2022-07-14 DIAGNOSIS — Z23 Encounter for immunization: Secondary | ICD-10-CM | POA: Diagnosis not present

## 2022-07-14 DIAGNOSIS — J9801 Acute bronchospasm: Secondary | ICD-10-CM

## 2022-07-14 DIAGNOSIS — M792 Neuralgia and neuritis, unspecified: Secondary | ICD-10-CM | POA: Diagnosis not present

## 2022-07-14 MED ORDER — ALBUTEROL SULFATE HFA 108 (90 BASE) MCG/ACT IN AERS
2.0000 | INHALATION_SPRAY | RESPIRATORY_TRACT | Status: DC | PRN
  Administered 2022-07-14: 2 via RESPIRATORY_TRACT
  Filled 2022-07-14: qty 6.7

## 2022-07-14 MED ORDER — NAPROXEN 250 MG PO TABS
500.0000 mg | ORAL_TABLET | Freq: Once | ORAL | Status: AC
  Administered 2022-07-14: 500 mg via ORAL
  Filled 2022-07-14: qty 2

## 2022-07-14 NOTE — ED Provider Notes (Signed)
[facility] Provider Note: Georgena Spurling, MD, FACEP  CSN: [id number] MRN: [id number] ARRIVAL: 07/13/22 at [place]: [place]  Breast Pain   HISTORY OF PRESENT ILLNESS  07/14/22 1:37 AM Amy Kent is a 41 y.o. female with a history of asthma who is here with a burning and throbbing pain to the left breast beginning about 2 hours prior to arrival.  It is worse with movement or deep breathing.  She is also having wheezing.  She states she does not currently have an inhaler.  1 was called in for her but the pharmacy was out of stock.  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 recurrent   Other acne 10/20/2008   Formatting of this note might be different from the original. Acne   Palpitations 03/21/2022   Sleep apnea    Mild    History reviewed. No pertinent surgical history.  Family History  Problem Relation Age of Onset   Diabetes Mother    Hypertension Mother    COPD Father     Social History   Tobacco Use   Smoking status: Former   Smokeless tobacco: Never  Substance Use Topics   Alcohol use: Yes    Comment: socially   Drug use: Yes    Types: Marijuana    Comment: occ    Prior to Admission medications   Medication Sig Start Date End Date Taking? Authorizing Provider  amphetamine-dextroamphetamine (ADDERALL) 20 MG tablet Take 1 tablet (20 mg total) by mouth daily before breakfast. 06/20/22 07/20/22  Jeanie Sewer, NP   amphetamine-dextroamphetamine (ADDERALL) 20 MG tablet Take 1 tablet (20 mg total) by mouth daily before breakfast. 07/20/22 08/19/22  Jeanie Sewer, NP  amphetamine-dextroamphetamine (ADDERALL) 20 MG tablet Take 1 tablet (20 mg total) by mouth daily before breakfast. 08/19/22 09/18/22  Jeanie Sewer, NP  budesonide (PULMICORT) 0.5 MG/2ML nebulizer solution Take 2 mLs (0.5 mg total) by nebulization 2 (two) times daily. 07/13/22   Jeanie Sewer, NP  budesonide-formoterol (SYMBICORT) 80-4.5 MCG/ACT inhaler Inhale 2 puffs into the lungs 2 (two) times daily. 07/13/22   Jeanie Sewer, NP  clonazePAM (KLONOPIN) 0.5 MG tablet Take 1 tablet (0.5 mg total) by mouth daily as needed for anxiety. 05/27/22   Jeanie Sewer, NP  cyclobenzaprine (FLEXERIL) 10 MG tablet Take 10 mg by mouth as needed for muscle spasms.    [provider]  diclofenac (VOLTAREN) 75 MG EC tablet Take 75 mg by mouth as needed for mild pain.    [provider]  ibuprofen (ADVIL) 800 MG tablet Take 800 mg by mouth every 6 (six) hours as needed. 05/28/22   [provider]  Liraglutide -Weight Management (SAXENDA) 18 MG/3ML SOPN Inject 0.6 mg into the skin daily. 06/20/22   Jeanie Sewer, NP  montelukast (SINGULAIR) 10 MG tablet Take 10 mg by mouth at bedtime.    [provider]  omeprazole (PRILOSEC) 20 MG capsule Take 1  capsule (20 mg total) by mouth daily. 06/20/22   Dulce Sellar, NP  ondansetron (ZOFRAN) 4 MG tablet Take 1 tablet (4 mg total) by mouth every 8 (eight) hours as needed for nausea or vomiting. 06/20/22   Dulce Sellar, NP  pregabalin (LYRICA) 25 MG capsule Take 1-2 capsules (25-50 mg total) by mouth 2 (two) times daily. START with 1 pill bid for a few days, then increase to 2 pills twice a day if no side effects. 07/13/22   Dulce Sellar, NP  Vitamin D, Ergocalciferol, (DRISDOL) 1.25 MG (50000 UNIT) CAPS capsule Take 50,000 Units by mouth every 7 (seven)  days.    [provider]  vortioxetine HBr (TRINTELLIX) 5 MG TABS tablet Take 1 tablet (5 mg total) by mouth daily. 05/27/22   Dulce Sellar, NP    Allergies Patient has no known allergies.   REVIEW OF SYSTEMS  Negative except as noted here or in the History of Present Illness.   PHYSICAL EXAMINATION  Initial Vital Signs Blood pressure (!) 147/100, pulse (!) 110, temperature 98.8 F (37.1 C), temperature source Oral, resp. rate 18, height 5\' 3"  (1.6 m), weight (!) 140.6 kg, last menstrual period 06/18/2022, SpO2 97 %.  Examination General: Well-developed, high BMI female in no acute distress; appearance consistent with age of record HENT: normocephalic; atraumatic Eyes: Normal appearance Neck: supple Heart: regular rate and rhythm Lungs: Decreased air movement with expiratory wheezing Chest: Rib point tenderness below left breast Abdomen: soft; nondistended; nontender; bowel sounds present Extremities: No deformity; full range of motion; pulses normal Neurologic: Awake, alert and oriented; motor function intact in all extremities and symmetric; no facial droop Skin: Warm and dry Psychiatric: Normal mood and affect   RESULTS  Summary of this visit's results, reviewed and interpreted by myself:   EKG Interpretation  Date/Time:  Wednesday July 13 2022 23:01:27 EDT Ventricular Rate:  102 PR Interval:  156 QRS Duration: 80 QT Interval:  364 QTC Calculation: 474 R Axis:   90 Text Interpretation: Sinus tachycardia Rightward axis Borderline ECG Rate is faster Confirmed by Izabela Ow, 12-23-1976 (Jonny Ruiz) on 07/14/2022 12:00:59 AM       Laboratory Studies: Results for orders placed or performed during the hospital encounter of 07/14/22 (from the past 24 hour(s))  Basic metabolic panel     Status: Abnormal   Collection Time: 07/13/22 11:08 PM  Result Value Ref Range   Sodium 137 135 - 145 mmol/L   Potassium 3.8 3.5 - 5.1 mmol/L   Chloride 105 98 - 111 mmol/L   CO2 25  22 - 32 mmol/L   Glucose, Bld 97 70 - 99 mg/dL   BUN 13 6 - 20 mg/dL   Creatinine, Ser 09/12/22 0.44 - 1.00 mg/dL   Calcium 8.7 (L) 8.9 - 10.3 mg/dL   GFR, Estimated 6.71 >24 mL/min   Anion gap 7 5 - 15  CBC     Status: Abnormal   Collection Time: 07/13/22 11:08 PM  Result Value Ref Range   WBC 11.4 (H) 4.0 - 10.5 K/uL   RBC 4.77 3.87 - 5.11 MIL/uL   Hemoglobin 12.6 12.0 - 15.0 g/dL   HCT 09/12/22 09.9 - 83.3 %   MCV 82.4 80.0 - 100.0 fL   MCH 26.4 26.0 - 34.0 pg   MCHC 32.1 30.0 - 36.0 g/dL   RDW 82.5 (H) 05.3 - 97.6 %   Platelets 300 150 - 400 K/uL   nRBC 0.0 0.0 - 0.2 %  Urinalysis, Routine w reflex microscopic  Urine, Clean Catch     Status: Abnormal   Collection Time: 07/13/22 11:08 PM  Result Value Ref Range   Color, Urine YELLOW YELLOW   APPearance CLEAR CLEAR   Specific Gravity, Urine 1.025 1.005 - 1.030   pH 7.0 5.0 - 8.0   Glucose, UA NEGATIVE NEGATIVE mg/dL   Hgb urine dipstick NEGATIVE NEGATIVE   Bilirubin Urine NEGATIVE NEGATIVE   Ketones, ur NEGATIVE NEGATIVE mg/dL   Protein, ur 30 (A) NEGATIVE mg/dL   Nitrite NEGATIVE NEGATIVE   Leukocytes,Ua NEGATIVE NEGATIVE  Pregnancy, urine     Status: None   Collection Time: 07/13/22 11:08 PM  Result Value Ref Range   Preg Test, Ur NEGATIVE NEGATIVE  Urinalysis, Microscopic (reflex)     Status: Abnormal   Collection Time: 07/13/22 11:08 PM  Result Value Ref Range   RBC / HPF 0-5 0 - 5 RBC/hpf   WBC, UA 0-5 0 - 5 WBC/hpf   Bacteria, UA RARE (A) NONE SEEN   Squamous Epithelial / LPF 0-5 0 - 5   Mucus PRESENT    Hyaline Casts, UA PRESENT   CBG monitoring, ED     Status: None   Collection Time: 07/13/22 11:10 PM  Result Value Ref Range   Glucose-Capillary 93 70 - 99 mg/dL   Comment 1 Notify RN    Imaging Studies: No results found.  ED COURSE and MDM  Nursing notes, initial and subsequent vitals signs, including pulse oximetry, reviewed and interpreted by myself.  Vitals:   07/13/22 2257 07/13/22 2258  BP:  (!)  147/100  Pulse:  (!) 110  Resp:  18  Temp:  98.8 F (37.1 C)  TempSrc:  Oral  SpO2:  97%  Weight: (!) 140.6 kg   Height: 5\' 3"  (1.6 m)    Medications  albuterol (VENTOLIN HFA) 108 (90 Base) MCG/ACT inhaler 2 puff (has no administration in time range)  naproxen (NAPROSYN) tablet 500 mg (500 mg Oral Given 07/14/22 0146)   The patient's EKG is nonischemic and her other lab work is reassuring.  She is having wheezing on exam and we will provide her an albuterol inhaler and AeroChamber and instruct her in their use.  The chest pain is most consistent with rib pain, possibly costochondritis, which may have been exacerbated by her current breathing difficulty.  He was advised to use NSAIDs for this and she has diclofenac and ibuprofen at home.   PROCEDURES  Procedures   ED DIAGNOSES     ICD-10-CM   1. Acute bronchospasm  J98.01     2. Chest wall pain  R07.89          12-18-1985, MD 07/14/22 [id number]

## 2022-07-14 NOTE — Telephone Encounter (Signed)
Patient states: - Wanted to let PCP know she went to ED today due to feeling throbbing/burning pain underneath left breast - After evaluation she was fine despite, protein and bacteria, in her urine - She is worried she may have a UTI that could be turning into a kidney infection  Patient requests: - A medication be sent in to treat protein and bacteria in urine   I offered patient an OV with PCP today but she declined. States she just saw PCP yesterday, 10/04, and went to the ED today.   Please advise.

## 2022-07-14 NOTE — Telephone Encounter (Signed)
The ER didn't treat her because it was a very small amount of bacteria, and she was not having urinary symptoms. She does not need an antibiotic. And her chest/breast pain is from her asthma/cough - she needs to start the symbicort inhaler & nebulizer med  I sent in - ER gave her an Albuterol inhaler.

## 2022-07-15 NOTE — Telephone Encounter (Signed)
I called pt, Pt gave a verbalized understanding and stated she was not able to pick up Symbicort inhaler due it being out of stock. Pt states they should have it in stock today. She will send a MyChart message when she is able to pick it up.

## 2022-07-22 IMAGING — MR MR CERVICAL SPINE W/O CM
5 series · 29 of 48 positions shown · non-contrast
Comparison: None.

CLINICAL DATA: Upper back pain with left arm numbness and weakness
for 1 year

EXAM:
MRI CERVICAL SPINE WITHOUT CONTRAST
TECHNIQUE: Multiplanar, multisequence MR imaging of the cervical spine was
performed. No intravenous contrast was administered.

[Series 5: T2 · sagittal · 3.0mm · 0.55mm/px · 7 of 15 slices shown (1 of 2)]
[im 1/15]
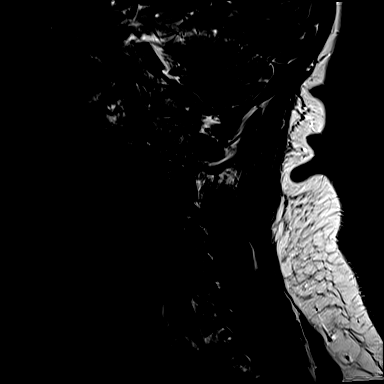
[im 3/15]
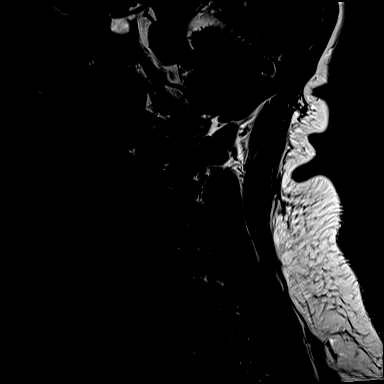
[im 5/15]
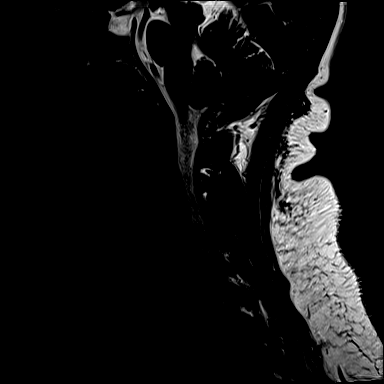
[im 8/15]
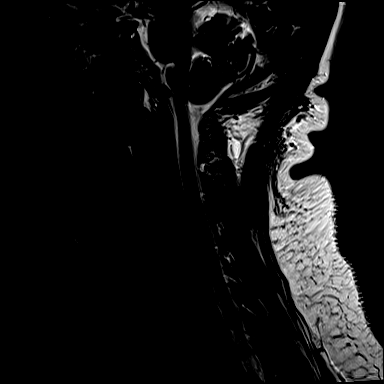
[im 10/15]
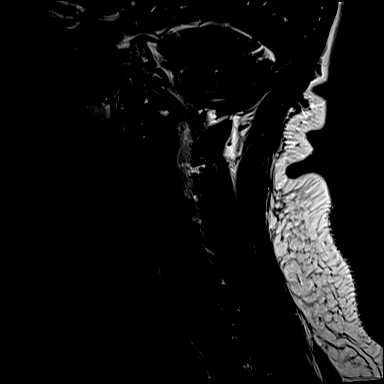
[im 12/15]
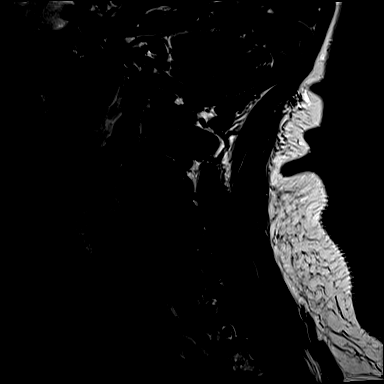
[im 15/15]
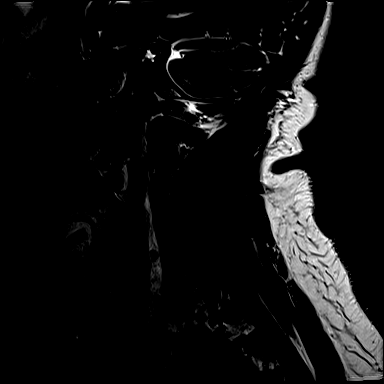

[Series 6: T1 · sagittal · 3.0mm · 0.66mm/px · 7 of 15 slices shown]
[im 1/15]
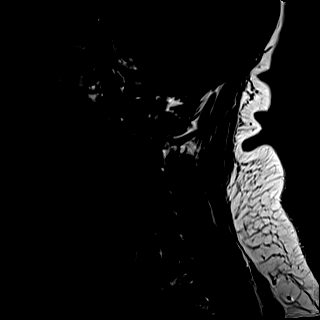
[im 3/15]
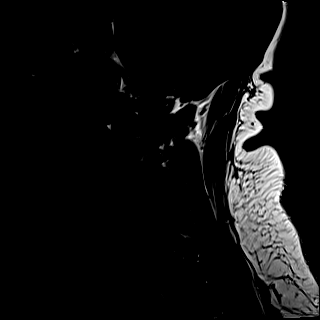
[im 5/15]
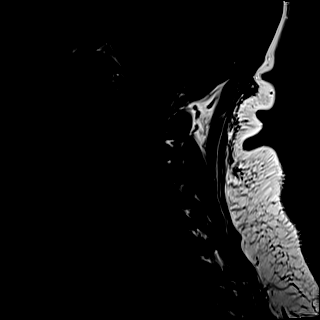
[im 8/15]
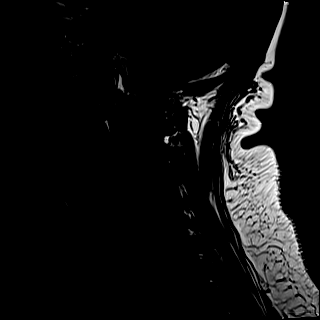
[im 10/15]
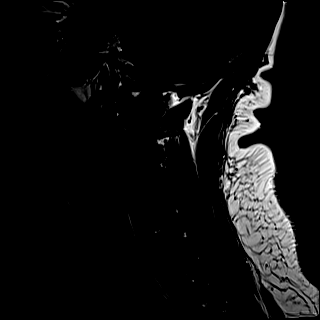
[im 12/15]
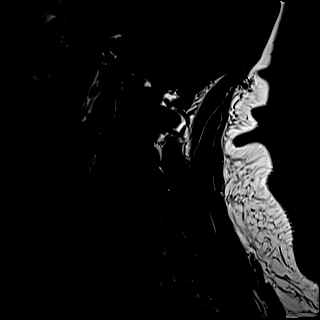
[im 15/15]
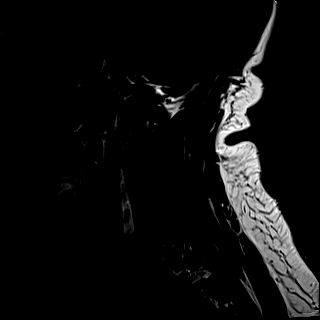

[Series 7: STIR · sagittal · 3.0mm · 0.33mm/px · 6 of 15 slices shown]
[im 1/15]
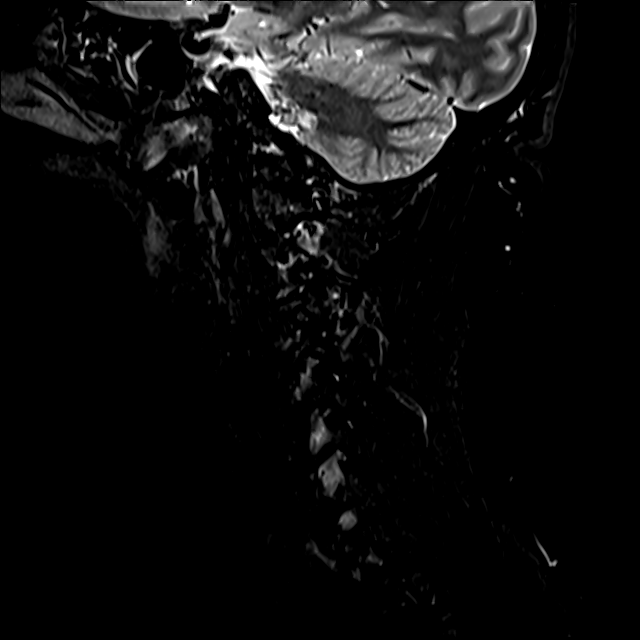
[im 3/15]
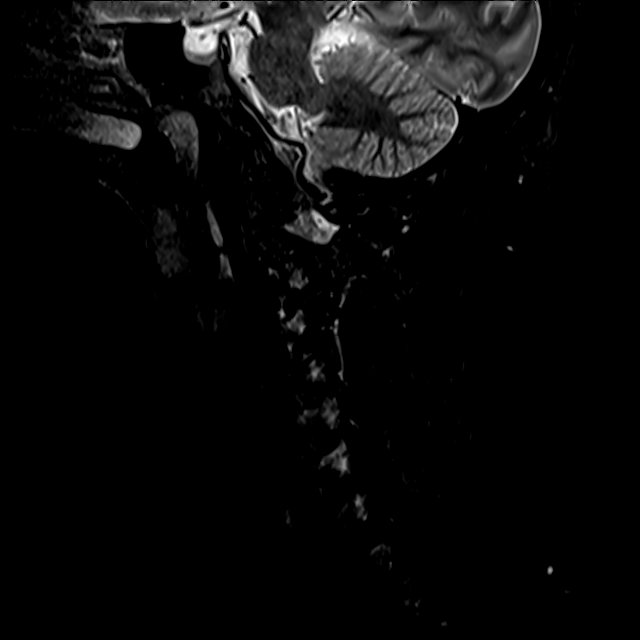
[im 6/15]
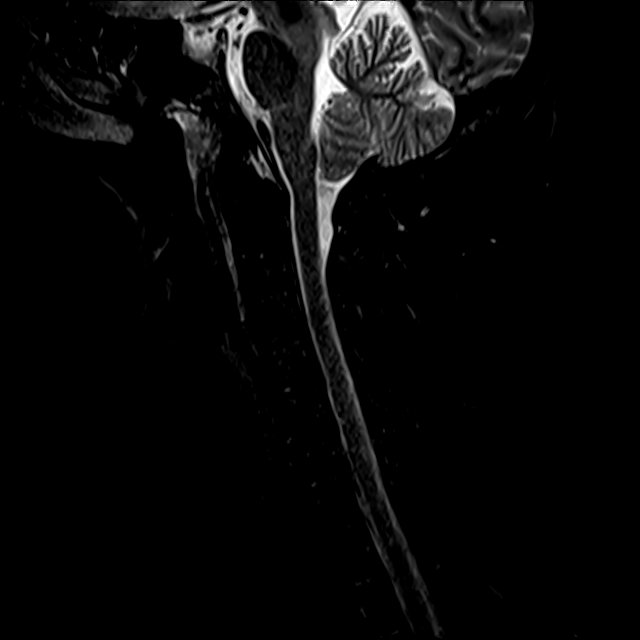
[im 9/15]
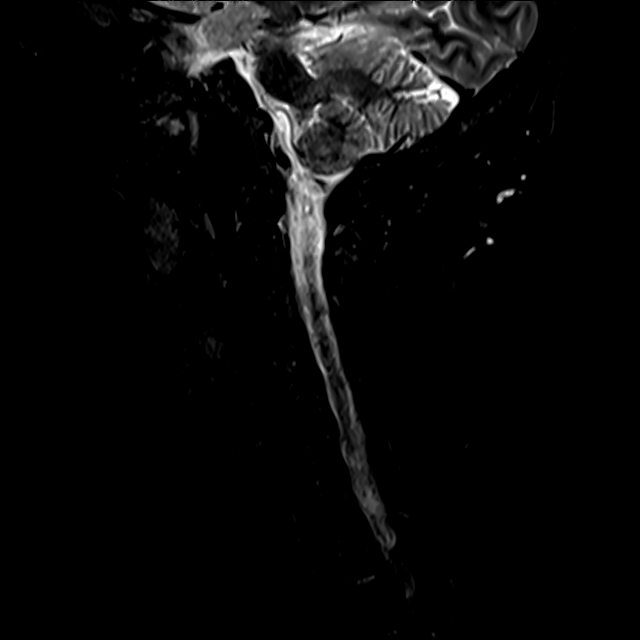
[im 12/15]
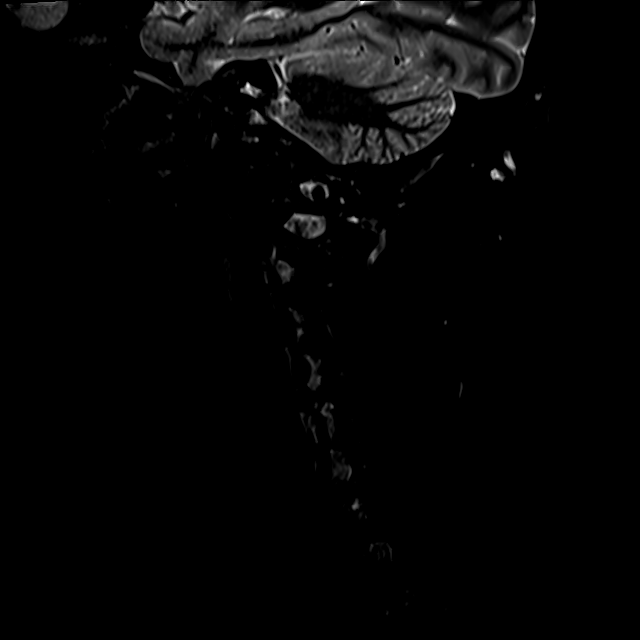
[im 15/15]
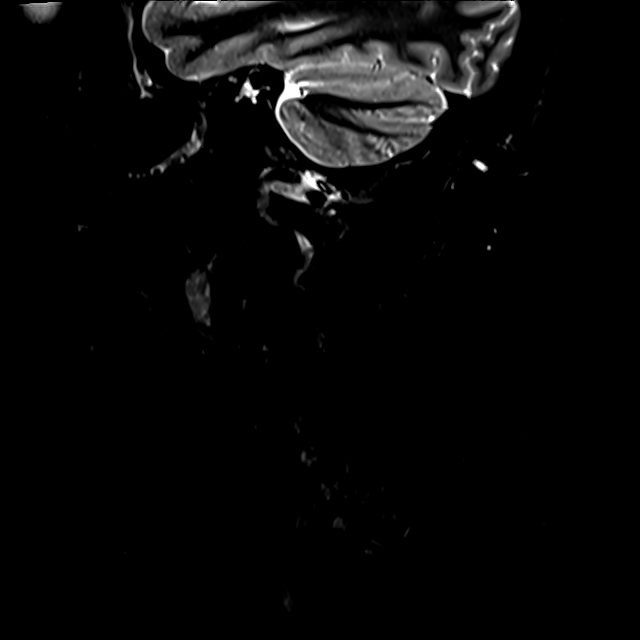

[Series 8: T2 · axial · 3.0mm · 0.50mm/px · z∈[-44,+60]mm · 8 of 34 slices shown (2 of 2)]
[im 1/34]
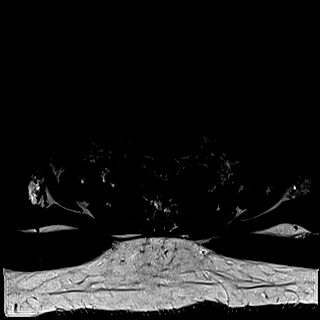
[im 6/34]
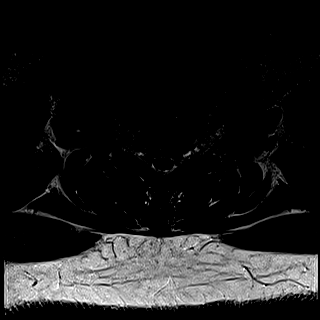
[im 11/34]
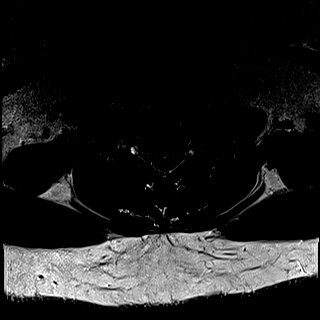
[im 16/34]
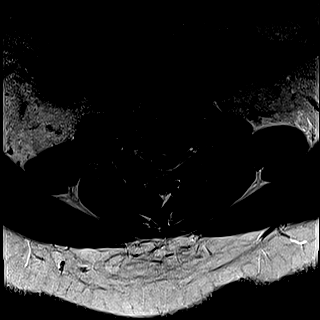
[im 18/34]
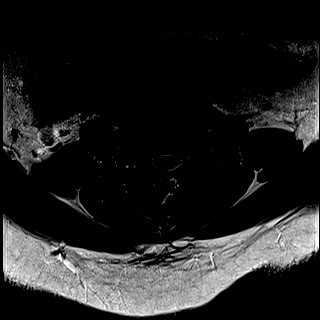
[im 23/34]
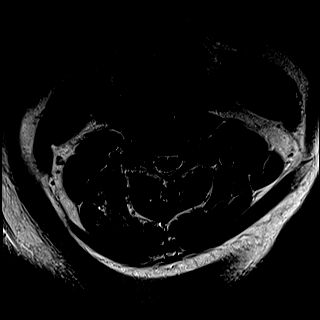
[im 28/34]
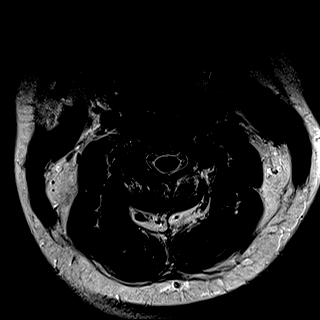
[im 34/34]
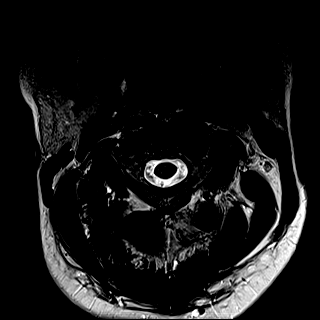

[Series 9: GRE · axial · 3.0mm · 0.42mm/px · 1 of 34 slices shown]
[im 1/34]
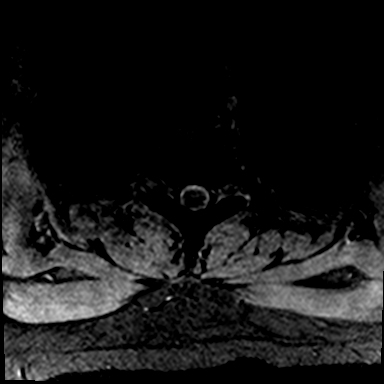

[29 of 48 positions shown; findings below may reference images not displayed]

FINDINGS: Alignment: Straightening of lumbar lordosis

Vertebrae: No fracture, evidence of discitis, or bone lesion.

Cord: Normal signal and morphology.

Posterior Fossa, vertebral arteries, paraspinal tissues: Negative.

Disc levels:

C4-5 central disc protrusion contacting but not compressing the
cord.

C5-6 tiny central protrusion at an annular fissure.

C6-7 borderline disc bulging.
IMPRESSION: C4-5 small central disc protrusion contacting but not compressing
the cord.

Diffusely patent foramina.

## 2022-07-25 NOTE — Progress Notes (Unsigned)
NEUROLOGY CONSULTATION NOTE  Amy Kent MRN: [id number] DOB: [birth year]  Referring provider: Arby Barrette, MD (ED referral) Primary care provider: Dulce Sellar, NP  Reason for consult:  dizziness, paresthesias  Assessment/Plan:   Various symptomatology including: - Head pressure - Dizziness - Blurred vision - Diffuse paroxysmal body pain - Paresthesias of bilateral lower extremities. With the MRI brain findings of partial empty sella, head pressure, dizziness and blurred vision, I think she should be evaluate for possible papilledema that may suggest idiopathic intracranial hypertension.  I have no explanation for the body pain and lower extremity paresthesias as extensive workup was negative.  Certainly, symptoms may be due to anxiety.  1 Will refer to ophthalmology for evaluation of blurred vision and possible papilledema 2  For further evaluation of paresthesias, will check B12 3   further recommendations pending results.   Subjective:  Amy Kent is a 41 year old right-handed female with ADHD, anxiety, depression, OSA and lumbar back pain who presents for dizziness and paresthesias.  History supplemented by prior neurology and ED notes.  In February 2022, she developed a rash on her legs that subsequently resolved but then developed sensation of tremors involving her entire body, more of a vibratory sensation.  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  She was started on propranolol for presumed anxiety which seemed to initially help.  In December 2022, she began experiencing intermittent dull non-throbbing head pressure.  No nausea, vomiting, photophobia or phonophobia.  She also started experiencing separate frequent episodes of dizziness, like a lightheadedness.  Occurs frequently.  On more rare occasion, she notes episodic blurred vision.Marland Kitchen  She was evaluated in the ED and by neurology.  CT head on 10/06/2021 was  stable.  Labs, including CBC, CMP, TSH, COVID and Flu, were unremarkable.  MRI of brain with and without contrast on 10/20/2021 again revealed partially empty sella but otherwise unremarkable.  She describes paroxysmal shooting pain involving any part of her body, including the head.  Usually occurs if she is laying down and then moves her body.  Continues to have a vibrating sensation in her legs.  No pain or weakness.  MRI of cervical spine on 10/23/2021 personally reviewed is unremarkable.  MRI of lumbar spine without contrast on 06/02/2022 showed mild degeneratve disc disease with type 1 Modic changes at L5-S1, Schmorl's node projecting through the inferior endplate of L5 and mild facet joint arthritis at L4-5 and L5-S1 but no spinal or foraminal stenosis.  NCV-EMG on 07/01/2022 was normal.  For dizziness/presyncope and palpitations, she was evaluated by cardiology.  Long-term cardiac monitoring was unremarkable.     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 recurrent   Other acne 10/20/2008   Formatting of this note might be different from the original. Acne   Palpitations 03/21/2022   Sleep apnea    Mild    PAST SURGICAL HISTORY: No past surgical history on file.  MEDICATIONS: Current Outpatient Medications on File Prior to  Visit  Medication Sig Dispense Refill   amphetamine-dextroamphetamine (ADDERALL) 20 MG tablet Take 1 tablet (20 mg total) by mouth daily before breakfast. 30 tablet 0   amphetamine-dextroamphetamine (ADDERALL) 20 MG tablet Take  1 tablet (20 mg total) by mouth daily before breakfast. 30 tablet 0   [START ON 08/19/2022] amphetamine-dextroamphetamine (ADDERALL) 20 MG tablet Take 1 tablet (20 mg total) by mouth daily before breakfast. 30 tablet 0   budesonide (PULMICORT) 0.5 MG/2ML nebulizer solution Take 2 mLs (0.5 mg total) by nebulization 2 (two) times daily. 120 mL 0   budesonide-formoterol (SYMBICORT) 80-4.5 MCG/ACT inhaler Inhale 2 puffs into the lungs 2 (two) times daily. 1 each 2   clonazePAM (KLONOPIN) 0.5 MG tablet Take 1 tablet (0.5 mg total) by mouth daily as needed for anxiety. 15 tablet 0   cyclobenzaprine (FLEXERIL) 10 MG tablet Take 10 mg by mouth as needed for muscle spasms.     diclofenac (VOLTAREN) 75 MG EC tablet Take 75 mg by mouth as needed for mild pain.     ibuprofen (ADVIL) 800 MG tablet Take 800 mg by mouth every 6 (six) hours as needed.     Liraglutide -Weight Management (SAXENDA) 18 MG/3ML SOPN Inject 0.6 mg into the skin daily. 3 mL 0   montelukast (SINGULAIR) 10 MG tablet Take 10 mg by mouth at bedtime.     omeprazole (PRILOSEC) 20 MG capsule Take 1 capsule (20 mg total) by mouth daily. 90 capsule 1   ondansetron (ZOFRAN) 4 MG tablet Take 1 tablet (4 mg total) by mouth every 8 (eight) hours as needed for nausea or vomiting. 20 tablet 0   pregabalin (LYRICA) 25 MG capsule Take 1-2 capsules (25-50 mg total) by mouth 2 (two) times daily. START with 1 pill bid for a few days, then increase to 2 pills twice a day if no side effects. 60 capsule 1   Vitamin D, Ergocalciferol, (DRISDOL) 1.25 MG (50000 UNIT) CAPS capsule Take 50,000 Units by mouth every 7 (seven) days.     vortioxetine HBr (TRINTELLIX) 5 MG TABS tablet Take 1 tablet (5 mg total) by mouth daily. 30 tablet 0   No current facility-administered medications on file prior to visit.    ALLERGIES: No Known Allergies  FAMILY HISTORY: Family History  Problem Relation Age of Onset   Diabetes Mother    Hypertension Mother    COPD Father      Objective:  Blood pressure 125/83, pulse 84, height 5\' 3"  (1.6 m), weight (!) 314 lb 12.8 oz (142.8 kg), SpO2 96 %. General: No acute distress.  Patient appears well-groomed.   Head:  Normocephalic/atraumatic Eyes:  fundi examined but not visualized Neck: supple, no paraspinal tenderness, full range of motion Back: No paraspinal tenderness Heart: regular rate and rhythm Lungs: Clear to auscultation bilaterally. Vascular: No carotid bruits. Neurological Exam: Mental status: alert and oriented to person, place, and time, speech fluent and not dysarthric, language intact. Cranial nerves: CN I: not tested CN II: pupils equal, round and reactive to light, visual fields intact CN III, IV, VI:  full range of motion, no nystagmus, no ptosis CN V: facial sensation intact. CN VII: upper and lower face symmetric CN VIII: hearing intact CN IX, X: gag intact, uvula midline CN XI: sternocleidomastoid and trapezius muscles intact CN XII: tongue midline Bulk & Tone: normal, no fasciculations. Motor:  muscle strength 5/5 throughout Sensation:  Pinprick and vibratory sensation intact. Deep Tendon Reflexes:  2+ throughout,  toes downgoing.   Finger  to nose testing:  Without dysmetria.   Heel to shin:  Without dysmetria.   Gait:  Normal station and stride.  Romberg negative.    Thank you for allowing me to take part in the care of this patient.  Metta Clines, DO  CC: Jeanie Sewer, NP

## 2022-07-26 ENCOUNTER — Encounter: Payer: Self-pay | Admitting: Neurology

## 2022-07-26 ENCOUNTER — Telehealth: Payer: Self-pay | Admitting: Anesthesiology

## 2022-07-26 ENCOUNTER — Ambulatory Visit: Payer: Federal, State, Local not specified - PPO | Admitting: Neurology

## 2022-07-26 ENCOUNTER — Other Ambulatory Visit (INDEPENDENT_AMBULATORY_CARE_PROVIDER_SITE_OTHER): Payer: Federal, State, Local not specified - PPO

## 2022-07-26 VITALS — BP 125/83 | HR 84 | Ht 63.0 in | Wt 314.8 lb

## 2022-07-26 DIAGNOSIS — R42 Dizziness and giddiness: Secondary | ICD-10-CM

## 2022-07-26 DIAGNOSIS — R202 Paresthesia of skin: Secondary | ICD-10-CM

## 2022-07-26 DIAGNOSIS — H538 Other visual disturbances: Secondary | ICD-10-CM | POA: Diagnosis not present

## 2022-07-26 LAB — VITAMIN B12: Vitamin B-12: 457 pg/mL (ref 211–911)

## 2022-07-26 NOTE — Telephone Encounter (Signed)
Patient forgot to mention during her visit with Dr Tomi Likens that her ears keep popping and she would like to know if it could be related to her current medical problem, also she would like to know if a referral to an ENT might be needed.

## 2022-07-26 NOTE — Telephone Encounter (Signed)
Pt called an informed that Dr Tomi Likens stated That sounds like a eustachian tube dysfunction, and she should  follow up with her PCP

## 2022-07-27 DIAGNOSIS — Z113 Encounter for screening for infections with a predominantly sexual mode of transmission: Secondary | ICD-10-CM | POA: Diagnosis not present

## 2022-07-27 DIAGNOSIS — N644 Mastodynia: Secondary | ICD-10-CM | POA: Diagnosis not present

## 2022-07-27 DIAGNOSIS — B3731 Acute candidiasis of vulva and vagina: Secondary | ICD-10-CM | POA: Diagnosis not present

## 2022-08-09 ENCOUNTER — Other Ambulatory Visit: Payer: Self-pay | Admitting: Family

## 2022-08-09 DIAGNOSIS — J45901 Unspecified asthma with (acute) exacerbation: Secondary | ICD-10-CM

## 2022-08-11 DIAGNOSIS — M47816 Spondylosis without myelopathy or radiculopathy, lumbar region: Secondary | ICD-10-CM | POA: Diagnosis not present

## 2022-08-11 DIAGNOSIS — M5459 Other low back pain: Secondary | ICD-10-CM | POA: Diagnosis not present

## 2022-08-18 ENCOUNTER — Ambulatory Visit: Payer: Federal, State, Local not specified - PPO | Admitting: Family

## 2022-08-19 ENCOUNTER — Ambulatory Visit: Payer: Federal, State, Local not specified - PPO | Admitting: Family

## 2022-08-19 ENCOUNTER — Encounter: Payer: Self-pay | Admitting: Family

## 2022-08-19 VITALS — BP 139/85 | HR 85 | Temp 97.8°F | Ht 63.0 in

## 2022-08-19 DIAGNOSIS — F411 Generalized anxiety disorder: Secondary | ICD-10-CM

## 2022-08-19 DIAGNOSIS — J4530 Mild persistent asthma, uncomplicated: Secondary | ICD-10-CM | POA: Diagnosis not present

## 2022-08-19 MED ORDER — CLONAZEPAM 0.5 MG PO TABS: 0.2500 mg | ORAL_TABLET | Freq: Every day | ORAL | 0 refills | Status: DC | PRN

## 2022-08-19 MED ORDER — ALBUTEROL SULFATE HFA 108 (90 BASE) MCG/ACT IN AERS: 2.0000 | INHALATION_SPRAY | Freq: Four times a day (QID) | RESPIRATORY_TRACT | 5 refills | Status: AC | PRN

## 2022-08-19 MED ORDER — VORTIOXETINE HBR 10 MG PO TABS: 5.0000 mg | ORAL_TABLET | Freq: Every day | ORAL | 2 refills | Status: DC

## 2022-08-19 NOTE — Progress Notes (Unsigned)
Patient ID: Amy Kent, female    DOB: 1981-06-09, 41 y.o.   MRN: [id number]  Chief Complaint  Patient presents with   Pressure Behind the Eyes    Pt states pressure in sinus area and sometimes back of head.    Asthma   Anxiety    HPI:      Asthma:  pt recently w/exacerbation with coughing, a lot of phlegm, and wheezing, and restarted Symbicort and Albuterol rescue, and also has Singulair but she forgets to take. Pt reports not taking the Symbicort daily and does not have refills for the rescue (given in ER).  Sinus pressure:  has been feeling pressure in her forehead and all of her head, which started last year in Dec. and occurs intermittently. 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 Denies any other sinus sx or vision changes. Anxiety/Depression:  pt reports many year history of anxiety and has failed many meds. She thinks she is very sensitive to medications as even small doses cause side effects: Ativan was most helpful but still caused SOB as well as Hydroxyzine as well as drowsiness even at 10mg  dose, Trazodone- drowsiness during day, Lexapro - anorgasmia. Started on Klonopin prn and Trintellix, pt took samples but states she did not notice any difference. She took 1 pill of 0.5mg  klonopin last evening, caused freq. urination, she slept, but woke up at around 4a and could not go back to sleep and feels still drowsy all morning.     Assessment & Plan:   Problem List Items Addressed This Visit       Respiratory   Mild persistent asthma without complication    Chronic exp wheezing noted on exam, pt states she is not using Symbicort daily advised on using 2 puffs bid, use for 2 weeks and if still having wheezing or SOB let me know needs refill of Albuterol f/u 1 month      Relevant Medications   albuterol (VENTOLIN HFA) 108 (90 Base) MCG/ACT inhaler     Other   Generalized anxiety disorder - Primary     chronic pt reports not taking the Trintellix, didn't think it was helping, never did follow up pt c/o still feeling overwhelmed and gets scared to take all of the meds together and afraid she "might die" b/c she lives alone & no one would know sending higher dose, 10mg  of Trintellix, advised of importance in taking daily f/u 1 month       Relevant Medications   clonazePAM (KLONOPIN) 0.5 MG tablet   vortioxetine HBr (TRINTELLIX) 10 MG TABS tablet   Subjective:    Outpatient Medications Prior to Visit  Medication Sig Dispense Refill   amphetamine-dextroamphetamine (ADDERALL) 20 MG tablet Take 1 tablet (20 mg total) by mouth daily before breakfast. 30 tablet 0   amphetamine-dextroamphetamine (ADDERALL) 20 MG tablet Take 1 tablet (20 mg total) by mouth daily before breakfast. 30 tablet 0   budesonide (PULMICORT) 0.5 MG/2ML nebulizer solution TAKE 2 ML (0.5 MG TOTAL) BY NEBULIZATION TWICE A DAY 360 mL 1   budesonide-formoterol (SYMBICORT) 80-4.5 MCG/ACT inhaler Inhale 2 puffs into the lungs 2 (two) times daily. 1 each 2   cyclobenzaprine (FLEXERIL) 10 MG tablet Take 10 mg by mouth as needed for muscle spasms.     diclofenac (VOLTAREN) 75 MG EC tablet Take 75 mg by mouth as needed for mild pain.     ibuprofen (ADVIL)  800 MG tablet Take 800 mg by mouth every 6 (six) hours as needed.     Liraglutide -Weight Management (SAXENDA) 18 MG/3ML SOPN Inject 0.6 mg into the skin daily. 3 mL 0   montelukast (SINGULAIR) 10 MG tablet Take 10 mg by mouth at bedtime.     omeprazole (PRILOSEC) 20 MG capsule Take 1 capsule (20 mg total) by mouth daily. 90 capsule 1   ondansetron (ZOFRAN) 4 MG tablet Take 1 tablet (4 mg total) by mouth every 8 (eight) hours as needed for nausea or vomiting. 20 tablet 0   pregabalin (LYRICA) 25 MG capsule Take 1-2 capsules (25-50 mg total) by mouth 2 (two) times daily. START with 1 pill bid for a few days, then increase to 2 pills twice a day if no side effects. 60 capsule 1    Vitamin D, Ergocalciferol, (DRISDOL) 1.25 MG (50000 UNIT) CAPS capsule Take 50,000 Units by mouth every 7 (seven) days.     clonazePAM (KLONOPIN) 0.5 MG tablet Take 1 tablet (0.5 mg total) by mouth daily as needed for anxiety. 15 tablet 0   vortioxetine HBr (TRINTELLIX) 5 MG TABS tablet Take 1 tablet (5 mg total) by mouth daily. 30 tablet 0   amphetamine-dextroamphetamine (ADDERALL) 20 MG tablet Take 1 tablet (20 mg total) by mouth daily before breakfast. 30 tablet 0   No facility-administered medications prior to visit.   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 recurrent   Other acne 10/20/2008   Formatting of this note might be different from the original. Acne   Palpitations 03/21/2022   Sleep apnea    Mild   History reviewed. No pertinent surgical history. No Known Allergies    Objective:    Physical Exam Vitals and nursing note reviewed.  Constitutional:      Appearance: Normal appearance. She is obese.  Cardiovascular:     Rate and Rhythm: Normal rate and regular rhythm.  Pulmonary:     Effort: Pulmonary effort is normal.     Breath sounds: Wheezing (expiratory) present.  Musculoskeletal:        General: Normal range of motion.  Skin:    General: Skin is warm and dry.  Neurological:     Mental Status: She is alert.  Psychiatric:        Mood and Affect: Mood normal.        Behavior: Behavior normal.    BP 139/85 (BP Location: Left Arm, Patient Position: Sitting)   Pulse 85   Temp 97.8 F (36.6 C) (Temporal)   Ht 5\' 3"  (1.6 m)   LMP  07/18/2022 (Exact Date)   SpO2 96%   BMI 55.76 kg/m  Wt Readings from Last 3 Encounters:  07/26/22 (!) 314 lb 12.8 oz (142.8 kg)  07/13/22 (!) 310 lb (140.6 kg)  07/13/22 (!) 312 lb 2 oz (141.6 kg)      Jeanie Sewer, NP

## 2022-08-21 ENCOUNTER — Encounter: Payer: Self-pay | Admitting: Family

## 2022-08-21 NOTE — Assessment & Plan Note (Addendum)
chronic pt reports not taking the Trintellix, didn't think it was helping, never did follow up pt c/o still feeling overwhelmed and gets scared to take all of the meds together and afraid she "might die" b/c she lives alone & no one would know sending higher dose, 10mg  of Trintellix, advised of importance in taking daily f/u 1 month

## 2022-08-21 NOTE — Assessment & Plan Note (Addendum)
Chronic exp wheezing noted on exam, pt states she is not using Symbicort daily advised on using 2 puffs bid, use for 2 weeks and if still having wheezing or SOB let me know needs refill of Albuterol f/u 1 month

## 2022-08-26 DIAGNOSIS — M79605 Pain in left leg: Secondary | ICD-10-CM | POA: Diagnosis not present

## 2022-08-30 DIAGNOSIS — G4733 Obstructive sleep apnea (adult) (pediatric): Secondary | ICD-10-CM | POA: Diagnosis not present

## 2022-08-30 DIAGNOSIS — F419 Anxiety disorder, unspecified: Secondary | ICD-10-CM | POA: Diagnosis not present

## 2022-08-31 ENCOUNTER — Telehealth: Payer: Self-pay | Admitting: Family

## 2022-08-31 ENCOUNTER — Other Ambulatory Visit: Payer: Self-pay

## 2022-08-31 ENCOUNTER — Emergency Department (HOSPITAL_BASED_OUTPATIENT_CLINIC_OR_DEPARTMENT_OTHER): Payer: Federal, State, Local not specified - PPO

## 2022-08-31 ENCOUNTER — Emergency Department (HOSPITAL_BASED_OUTPATIENT_CLINIC_OR_DEPARTMENT_OTHER)
Admission: EM | Admit: 2022-08-31 | Discharge: 2022-08-31 | Disposition: A | Discharge status: Home / Self Care | Payer: Federal, State, Local not specified - PPO | Attending: Emergency Medicine | Admitting: Emergency Medicine

## 2022-08-31 ENCOUNTER — Encounter (HOSPITAL_BASED_OUTPATIENT_CLINIC_OR_DEPARTMENT_OTHER): Payer: Self-pay | Admitting: Emergency Medicine

## 2022-08-31 DIAGNOSIS — R200 Anesthesia of skin: Secondary | ICD-10-CM | POA: Diagnosis not present

## 2022-08-31 DIAGNOSIS — R202 Paresthesia of skin: Secondary | ICD-10-CM | POA: Diagnosis not present

## 2022-08-31 DIAGNOSIS — R519 Headache, unspecified: Secondary | ICD-10-CM | POA: Diagnosis not present

## 2022-08-31 LAB — CBC WITH DIFFERENTIAL/PLATELET
Abs Immature Granulocytes: 0.02 10*3/uL (ref 0.00–0.07)
Basophils Absolute: 0.1 10*3/uL (ref 0.0–0.1)
Basophils Relative: 1 %
Eosinophils Absolute: 0.3 10*3/uL (ref 0.0–0.5)
Eosinophils Relative: 3 %
HCT: 39.3 % (ref 36.0–46.0)
Hemoglobin: 12.4 g/dL (ref 12.0–15.0)
Immature Granulocytes: 0 %
Lymphocytes Relative: 21 %
Lymphs Abs: 2.1 10*3/uL (ref 0.7–4.0)
MCH: 26.6 pg (ref 26.0–34.0)
MCHC: 31.6 g/dL (ref 30.0–36.0)
MCV: 84.3 fL (ref 80.0–100.0)
Monocytes Absolute: 0.9 10*3/uL (ref 0.1–1.0)
Monocytes Relative: 9 %
Neutro Abs: 6.7 10*3/uL (ref 1.7–7.7)
Neutrophils Relative %: 66 %
Platelets: 352 10*3/uL (ref 150–400)
RBC: 4.66 MIL/uL (ref 3.87–5.11)
RDW: 14.6 % (ref 11.5–15.5)
WBC: 10.1 10*3/uL (ref 4.0–10.5)
nRBC: 0 % (ref 0.0–0.2)

## 2022-08-31 LAB — COMPREHENSIVE METABOLIC PANEL
ALT: 14 U/L (ref 0–44)
AST: 21 U/L (ref 15–41)
Albumin: 3.7 g/dL (ref 3.5–5.0)
Alkaline Phosphatase: 77 U/L (ref 38–126)
Anion gap: 7 (ref 5–15)
BUN: 14 mg/dL (ref 6–20)
CO2: 27 mmol/L (ref 22–32)
Calcium: 8.8 mg/dL — ABNORMAL LOW (ref 8.9–10.3)
Chloride: 105 mmol/L (ref 98–111)
Creatinine, Ser: 0.96 mg/dL (ref 0.44–1.00)
GFR, Estimated: >60 mL/min (ref >60)
Glucose, Bld: 121 mg/dL — ABNORMAL HIGH (ref 70–99)
Potassium: 3.9 mmol/L (ref 3.5–5.1)
Sodium: 139 mmol/L (ref 135–145)
Total Bilirubin: 0.3 mg/dL (ref 0.3–1.2)
Total Protein: 7.8 g/dL (ref 6.5–8.1)

## 2022-08-31 MED ORDER — LORAZEPAM 1 MG PO TABS
1.0000 mg | ORAL_TABLET | Freq: Once | ORAL | Status: AC
  Administered 2022-08-31: 1 mg via ORAL
  Filled 2022-08-31: qty 1

## 2022-08-31 NOTE — ED Provider Notes (Signed)
I provided a substantive portion of the care of this patient.  I personally performed the entirety of the history, exam, and medical decision making for this encounter.  EKG Interpretation  Date/Time:  Wednesday August 31 2022 14:32:47 EST Ventricular Rate:  96 PR Interval:  149 QRS Duration: 89 QT Interval:  351 QTC Calculation: 444 R Axis:   95 Text Interpretation: Sinus rhythm Borderline right axis deviation Confirmed by Vanetta Mulders [phone]) on 08/31/2022 5:17:22 PM   Patient seen by me along with the physician assistant.  Patient here with constellation of symptoms to include head pressure and numbness to the left arm.  Chart review shows that patient is followed by [facility] neurosurgery followed by neurology has had multiple scans and MRIs.  No evidence of any acute findings the symptoms have been going on for about a year but the head pressures been about the last month.  So clinically there seems to be a component of anxiety may be panic attack.  When the symptoms occur.  Patient is very worried that it would be related to a stroke.  But due to the very repetitive recurrence of the symptoms it is unlikely.  Head CT done here today just since she has not had a head CT since about August.  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  No ventricular enlargement.  And the CT had no evidence of hydrocephalus.  Patient to follow-up with her neurologist regarding this.   Vanetta Mulders, MD 08/31/22 [phone]

## 2022-08-31 NOTE — Telephone Encounter (Signed)
Patient Name: Amy Kent [facility] RNES Gender: Female DOB: [birth year] Age: 41 Y 1 M 16 D Return Phone Number: [phone] (Primary) Address: City/ State/ Zip: [place]  [address] Client [facility] - [facility], [facility] at [facility] Contact Type Call Who Is Calling Patient / Member / Family / Caregiver Call Type Triage / Clinical Relationship To Patient Self Return Phone Number [phone] (Primary) Chief Complaint Tingling Reason for Call Symptomatic / Request for Health Information Initial Comment Caller states she has a lot anxiety, dizziness, her hands and feet keep tingling on and off. Translation No Nurse Assessment Nurse: Shawnie Dapper, RN, Arline Asp Date/Time (Eastern Time): 08/31/2022 2:07:58 PM Confirm and document reason for call. If symptomatic, describe symptoms. ---Caller states that she is having tingling to her left arm and dizziness. Caller states that she has a strange sensation. Caller states that she is due to have an MRI. Does the patient have any new or worsening symptoms? ---Yes Will a triage be completed? ---Yes Related visit to physician within the last 2 weeks? ---Yes Does the PT have any chronic conditions? (i.e. diabetes, asthma, this includes High risk factors for pregnancy, etc.) ---Yes List chronic conditions. ---anxiety, apnea Is the patient pregnant or possibly pregnant? (Ask all females between the ages of 39-55) ---No Is this a behavioral health or substance abuse call? ---No Guidelines Guideline Title Affirmed Question Affirmed Notes Nurse Date/Time Lamount Cohen Time) Neurologic Deficit Neck pain (and neurologic deficit) Shawnie Dapper, RN, Arline Asp 08/31/2022 2:10:06 PM Disp. Time Lamount Cohen Time) Disposition Final User 08/31/2022 2:13:53 PM See HCP within 4 Hours (or PCP triage) Yes Shawnie Dapper, RN, Arline Asp  Final Disposition 08/31/2022 2:13:53 PM See HCP within 4 Hours (or PCP triage) Yes Shawnie Dapper, RN,  Ave Filter Disagree/Comply Comply Caller Understands Yes PreDisposition Go to ED Care Advice Given Per Guideline SEE HCP (OR PCP TRIAGE) WITHIN 4 HOURS: * IF OFFICE WILL BE OPEN: You need to be seen within the next 3 or 4 hours. Call your doctor (or NP/PA) now or as soon as the office opens. CALL BACK IF: * You become worse CARE ADVICE given per Neurologic Deficit (Adult) guideline. Referrals [facility] - ED

## 2022-08-31 NOTE — Discharge Instructions (Signed)
Evaluation for left-sided numbness was overall reassuring.  Today's head CT and prior did reveal concern for findings consistent with possible idiopathic intracranial hypertension.  Recommend that you follow-up with neurologist to be evaluated more thoroughly for this possible diagnosis.  If you have new facial droop, slurred speech, weakness or tingling in your extremities, changes in her gait please return to the emergency department for further evaluation.

## 2022-08-31 NOTE — ED Triage Notes (Signed)
Pt c/o left sided numbness/tingling in left arm and left leg. Also intermittently in right arm. Pt reports she was seen by spine doctor and they ordered a thoracic MRI. Sx starts 3 days ago. Denies sx of weakness, balance issues, vision changes. Reports hx of same sx in the past.

## 2022-08-31 NOTE — ED Notes (Signed)
ED Provider at bedside. 

## 2022-08-31 NOTE — ED Provider Notes (Signed)
[facility] EMERGENCY DEPARTMENT Provider Note   CSN: [id number] Arrival date & time: 08/31/22  1418     History  Chief Complaint  Patient presents with   Numbness   HPI Amy Kent is a 41 y.o. female generalized anxiety disorder, major depression, and panic disorder presenting for left side numbness. Started four days ago. Numbness and tingling located in the left arm and left leg. Symptoms come and go. At this time she is not experiencing numbness or tingling.  States she had a EMG study of her left arm and leg recently which was normal. She is dealt with the symptoms off and on for the last year.  No chest pain or shortness of breath. The numbness and tingling happened all of a sudden around 3 AM this morning. Patient was concerned that she was having a stroke and called EMS. EMS evaluated and per patient she was advised that clinical presentation did not warrant evaluation emergency department.  Evaluated in August for left arm numbness and blurry vision but overall workup was reassuring.    HPI     Home Medications Prior to Admission medications   Medication Sig Start Date End Date Taking? Authorizing Provider  albuterol (VENTOLIN HFA) 108 (90 Base) MCG/ACT inhaler Inhale 2 puffs into the lungs every 6 (six) hours as needed for wheezing or shortness of breath. 08/19/22   Dulce Sellar, NP  amphetamine-dextroamphetamine (ADDERALL) 20 MG tablet Take 1 tablet (20 mg total) by mouth daily before breakfast. 07/20/22 08/19/22  Dulce Sellar, NP  amphetamine-dextroamphetamine (ADDERALL) 20 MG tablet Take 1 tablet (20 mg total) by mouth daily before breakfast. 08/19/22 09/18/22  Hudnell, Judeth Cornfield, NP  budesonide (PULMICORT) 0.5 MG/2ML nebulizer solution TAKE 2 ML (0.5 MG TOTAL) BY NEBULIZATION TWICE A DAY 08/09/22   Hudnell, Judeth Cornfield, NP  budesonide-formoterol (SYMBICORT) 80-4.5 MCG/ACT inhaler Inhale 2 puffs into the lungs 2 (two) times daily. 07/13/22   Dulce Sellar, NP  clonazePAM (KLONOPIN) 0.5 MG tablet Take 0.5 tablets (0.25 mg total) by mouth daily as needed for anxiety. 08/19/22   Dulce Sellar, NP  cyclobenzaprine (FLEXERIL) 10 MG tablet Take 10 mg by mouth as needed for muscle spasms.    [provider]  diclofenac (VOLTAREN) 75 MG EC tablet Take 75 mg by mouth as needed for mild pain.    [provider]  ibuprofen (ADVIL) 800 MG tablet Take 800 mg by mouth every 6 (six) hours as needed. 05/28/22   [provider]  Liraglutide -Weight Management (SAXENDA) 18 MG/3ML SOPN Inject 0.6 mg into the skin daily. 06/20/22   Dulce Sellar, NP  montelukast (SINGULAIR) 10 MG tablet Take 10 mg by mouth at bedtime.    [provider]  omeprazole (PRILOSEC) 20 MG capsule Take 1 capsule (20 mg total) by mouth daily. 06/20/22   Dulce Sellar, NP  ondansetron (ZOFRAN) 4 MG tablet Take 1 tablet (4 mg total) by mouth every 8 (eight) hours as needed for nausea or vomiting. 06/20/22   Dulce Sellar, NP  pregabalin (LYRICA) 25 MG capsule Take 1-2 capsules (25-50 mg total) by mouth 2 (two) times daily. START with 1 pill bid for a few days, then increase to 2 pills twice a day if no side effects. 07/13/22   Dulce Sellar, NP  Vitamin D, Ergocalciferol, (DRISDOL) 1.25 MG (50000 UNIT) CAPS capsule Take 50,000 Units by mouth every 7 (seven) days.    [provider]  vortioxetine HBr (TRINTELLIX) 10 MG TABS tablet Take 0.5 tablets (5  mg total) by mouth daily. 08/19/22   Dulce Sellar, NP      Allergies    Patient has no known allergies.    Review of Systems   Review of Systems  Neurological:  Positive for numbness.    Physical Exam Updated Vital Signs BP 138/78   Pulse (!) 101   Temp 98.7 F (37.1 C)   Resp 18   Ht 5\' 3"  (1.6 m)   Wt (!) 142.9 kg   LMP 08/21/2022 (Exact Date)   SpO2 100%   BMI 55.80 kg/m  Physical Exam Vitals and nursing note reviewed.  HENT:     Head: Normocephalic  and atraumatic.     Mouth/Throat:     Mouth: Mucous membranes are moist.  Eyes:     General:        Right eye: No discharge.        Left eye: No discharge.     Conjunctiva/sclera: Conjunctivae normal.  Cardiovascular:     Rate and Rhythm: Normal rate and regular rhythm.     Pulses: Normal pulses.     Heart sounds: Normal heart sounds.  Pulmonary:     Effort: Pulmonary effort is normal.     Breath sounds: Normal breath sounds.  Abdominal:     General: Abdomen is flat.     Palpations: Abdomen is soft.  Skin:    General: Skin is warm and dry.  Neurological:     General: No focal deficit present.     Comments: GCS 15. Speech is goal oriented. No deficits appreciated to CN III-XII; symmetric eyebrow raise, no facial drooping, tongue midline. Patient has equal grip strength bilaterally with 5/5 strength against resistance in all major muscle groups bilaterally. Sensation to light touch intact. Patient moves extremities without ataxia. Normal finger-nose-finger. Patient ambulatory with steady gait.  Psychiatric:        Mood and Affect: Mood normal.     ED Results / Procedures / Treatments   Labs (all labs ordered are listed, but only abnormal results are displayed) Labs Reviewed  COMPREHENSIVE METABOLIC PANEL - Abnormal; Notable for the following components:      Result Value   Glucose, Bld 121 (*)    Calcium 8.8 (*)    All other components within normal limits  CBC WITH DIFFERENTIAL/PLATELET    EKG EKG Interpretation  Date/Time:  Wednesday August 31 2022 14:32:47 EST Ventricular Rate:  96 PR Interval:  149 QRS Duration: 89 QT Interval:  351 QTC Calculation: 444 R Axis:   95 Text Interpretation: Sinus rhythm Borderline right axis deviation Confirmed by [id number] [phone]) on 08/31/2022 5:17:22 PM  Radiology CT Head Wo Contrast  Result Date: 08/31/2022 CLINICAL DATA:  Headache, increasing frequency or severity EXAM: CT HEAD WITHOUT CONTRAST TECHNIQUE: Contiguous  axial images were obtained from the base of the skull through the vertex without intravenous contrast. RADIATION DOSE REDUCTION: This exam was performed according to the departmental dose-optimization program which includes automated exposure control, adjustment of the mA and/or kV according to patient size and/or use of iterative reconstruction technique. COMPARISON:  CT head May 24, 2022 FINDINGS: Brain: No evidence of acute infarction, hemorrhage, hydrocephalus, extra-axial collection or mass lesion/mass effect. Partially empty sella. Vascular: No hyperdense vessel identified. Skull: No acute fracture. Sinuses/Orbits: Clear sinuses.  No acute orbital findings. Other: No mastoid effusions. IMPRESSION: 1. No evidence of acute intracranial abnormality. 2. Partially empty sella, which is often a normal anatomic variant but can be associated with idiopathic intracranial  hypertension (pseudotumor cerebri). Electronically Signed   By: Feliberto Harts M.D.   On: 08/31/2022 16:23    Procedures Procedures    Medications Ordered in ED Medications  LORazepam (ATIVAN) tablet 1 mg (1 mg Oral Given 08/31/22 1526)    ED Course/ Medical Decision Making/ A&P                           Medical Decision Making Risk Prescription drug management.   This patient presents to the ED for concern of left sided numbness, this involves a number of treatment options, and is a complaint that carries with it a high risk of complications and morbidity.  The differential diagnosis includes stroke, ICH, and cervical radiculopathy.   Co morbidities: Discussed in HPI   EMR reviewed including pt PMHx, past surgical history and past visits to ER.   CT head from February 2022 revealed partially empty sella but no intracranial abnormality. MRI of her lumbar spine without contrast in August 2023 showed mild degenerative disc disease but no spinal or foraminal stenosis. MRI of her cervical spine in January 2023 was  unremarkable.  NVC-EMG study  in September 2023 was normal.  See HPI for more details   Lab Tests:   I viewed and interpreted labs. Labs notable for hyperglycemia   Imaging Studies:  Abnormal findings. I viewed and interpreted all imaging studies. Imaging notable for partially empty sella    Cardiac Monitoring:  The patient was maintained on a cardiac monitor.  I personally viewed and interpreted the cardiac monitored which showed an underlying rhythm of: NSR EKG non-ischemic   Medicines ordered:  I ordered medication including ativan for anxiety Reevaluation of the patient after these medicines showed that the patient improved I have reviewed the patients home medicines and have made adjustments as needed    Consults/Attending Physician   I discussed this case with my attending physician who cosigned this note including patient's presenting symptoms, physical exam, and planned diagnostics and interventions. Attending physician stated agreement with plan or made changes to plan which were implemented.   Reevaluation:  After the interventions noted above I re-evaluated patient and found that they have :improved    Problem List / ED Course: Patient presented for concerns of left-sided numbness.  Reviewed her chart which revealed multiple encounters for various neurologic symptoms.  CT scans and MRIs were overall unremarkable but did reveal a partially empty sella which was read discovered today on head CT.  Patient did endorse new head pressure.  Could be consistent with IIH. Advised to f/u with neurologist.  Considered stroke and ICH but unlikely given reassuring neuroexam with no FND and unremarkable head CT.  Advised patient to follow-up with neuro. Discussed return precautions.   Dispostion:  After consideration of the diagnostic results and the patients response to treatment, I feel that the patent would benefit from discharge and follow up with neurology for new  head pressure concerning for IIH given known partially empty sella.          Final Clinical Impression(s) / ED Diagnoses Final diagnoses:  Numbness    Rx / DC Orders ED Discharge Orders     None         Gareth Eagle, PA-C 08/31/22 Elesa Massed, MD 09/08/22 1521

## 2022-08-31 NOTE — Telephone Encounter (Signed)
Patient states: - She has been experiencing hands pain/tingling for the past couple of days  - Currently having hand pain which woke up out of her sleep  - Due to physical symptoms, she is having severe anxiety   I offered patient to speak to a triage nurse since all  office schedules are full. Pt accepted and was transferred to triage.

## 2022-09-02 DIAGNOSIS — R202 Paresthesia of skin: Secondary | ICD-10-CM | POA: Diagnosis not present

## 2022-09-02 DIAGNOSIS — M47814 Spondylosis without myelopathy or radiculopathy, thoracic region: Secondary | ICD-10-CM | POA: Diagnosis not present

## 2022-09-02 DIAGNOSIS — R200 Anesthesia of skin: Secondary | ICD-10-CM | POA: Diagnosis not present

## 2022-09-04 ENCOUNTER — Encounter: Payer: Self-pay | Admitting: Family

## 2022-09-04 DIAGNOSIS — F411 Generalized anxiety disorder: Secondary | ICD-10-CM

## 2022-09-06 ENCOUNTER — Telehealth: Payer: Self-pay

## 2022-09-06 DIAGNOSIS — G932 Benign intracranial hypertension: Secondary | ICD-10-CM

## 2022-09-06 MED ORDER — VENLAFAXINE HCL ER 37.5 MG PO CP24: 37.5000 mg | ORAL_CAPSULE | Freq: Every day | ORAL | 0 refills | Status: DC

## 2022-09-06 NOTE — Telephone Encounter (Signed)
Received notes from the optometrist regarding evaluation on 10/26.  No evidence of papilledema.  That doesn't definitely rule out increased intracranial pressure.  Would recommend spinal tap to measure opening pressure (also check routine CSF for cell count, protein, glucose, gram stain and culture, cytology)

## 2022-09-06 NOTE — Telephone Encounter (Signed)
Spoke to [facility] will fax over records.

## 2022-09-06 NOTE — Telephone Encounter (Signed)
Spoke to patient to see where she went for eye exam. Per last Will refer to ophthalmology for evaluation of blurred vision and possible papilledema. Referral added for [facility], Per patient she was advised that the next available appt will be in 12/2022. So patient was seen at [facility] on [address] in [place]. Tried calling to have records faxed over no answer.

## 2022-09-07 ENCOUNTER — Ambulatory Visit: Payer: Federal, State, Local not specified - PPO | Admitting: Neurology

## 2022-09-09 MED ORDER — DESVENLAFAXINE SUCCINATE ER 25 MG PO TB24: 25.0000 mg | ORAL_TABLET | Freq: Every day | ORAL | 2 refills | Status: DC

## 2022-09-09 NOTE — Addendum Note (Signed)
Addended byDulce Sellar on: 09/09/2022 05:25 PM   Modules accepted: Orders

## 2022-09-14 DIAGNOSIS — N898 Other specified noninflammatory disorders of vagina: Secondary | ICD-10-CM | POA: Diagnosis not present

## 2022-09-14 DIAGNOSIS — R87810 Cervical high risk human papillomavirus (HPV) DNA test positive: Secondary | ICD-10-CM | POA: Diagnosis not present

## 2022-09-14 DIAGNOSIS — Z1151 Encounter for screening for human papillomavirus (HPV): Secondary | ICD-10-CM | POA: Diagnosis not present

## 2022-09-14 DIAGNOSIS — R350 Frequency of micturition: Secondary | ICD-10-CM | POA: Diagnosis not present

## 2022-09-14 DIAGNOSIS — Z01419 Encounter for gynecological examination (general) (routine) without abnormal findings: Secondary | ICD-10-CM | POA: Diagnosis not present

## 2022-09-14 DIAGNOSIS — Z113 Encounter for screening for infections with a predominantly sexual mode of transmission: Secondary | ICD-10-CM | POA: Diagnosis not present

## 2022-09-14 DIAGNOSIS — R87612 Low grade squamous intraepithelial lesion on cytologic smear of cervix (LGSIL): Secondary | ICD-10-CM | POA: Diagnosis not present

## 2022-09-14 DIAGNOSIS — R8290 Unspecified abnormal findings in urine: Secondary | ICD-10-CM | POA: Diagnosis not present

## 2022-09-19 ENCOUNTER — Encounter: Payer: Self-pay | Admitting: Family

## 2022-09-19 ENCOUNTER — Ambulatory Visit (INDEPENDENT_AMBULATORY_CARE_PROVIDER_SITE_OTHER): Payer: Federal, State, Local not specified - PPO | Admitting: Family

## 2022-09-19 VITALS — BP 117/62 | HR 86 | Temp 97.6°F | Ht 63.0 in | Wt 319.0 lb

## 2022-09-19 DIAGNOSIS — F908 Attention-deficit hyperactivity disorder, other type: Secondary | ICD-10-CM

## 2022-09-19 DIAGNOSIS — F411 Generalized anxiety disorder: Secondary | ICD-10-CM

## 2022-09-19 DIAGNOSIS — R635 Abnormal weight gain: Secondary | ICD-10-CM | POA: Diagnosis not present

## 2022-09-19 MED ORDER — AMPHETAMINE-DEXTROAMPHETAMINE 20 MG PO TABS: 20.0000 mg | ORAL_TABLET | Freq: Every day | ORAL | 0 refills | Status: DC

## 2022-09-19 MED ORDER — CLONAZEPAM 0.5 MG PO TABS: 0.2500 mg | ORAL_TABLET | Freq: Every day | ORAL | 2 refills | Status: DC | PRN

## 2022-09-19 NOTE — Assessment & Plan Note (Signed)
chronic never started Saxenda, states box says to not use if opened for 30d, advised this is only the one pen not the whole box reminded pt start at 0.6mg  qd, go to 1.2 after 1-2 w if tolerated and stay at that dose until I see her again advised on other wt loss strategies, good eating habits, increasing daily exercise, drinking 2L water qd f/u in 1 month

## 2022-09-19 NOTE — Assessment & Plan Note (Addendum)
chronic pt on Adderall 20mg  bid, does not take qd PDMP checked & verified sending refill f/u in 3 mos

## 2022-09-19 NOTE — Assessment & Plan Note (Signed)
chronic pt called after last visit, Trintellix copay too $, switched to Pristiq 25mg  pt did not start yet, she is having a spinal tap coming up Jan. 11 and wasn't sure if safe to take has taken the Klonopin qd advised pt to start the Pristiq, due to her anxiety about her health it is very important she take this medication before all others right now, besides her inhalers f/u 1 month

## 2022-09-19 NOTE — Progress Notes (Signed)
Patient ID: Amy Kent, female    DOB: [birth year], 41 y.o.   MRN: [id number]  Chief Complaint  Patient presents with  . Weight Loss  . ADHD    Follow up   HPI: ADHD f/u: Medications helping target goals: Adderall 20mg  IR Regimen: daily Medication side effects/concerns: pt denies Weight: no change Sleep: no issues Mood changes:  none Tics: pt denies Blood pressure, Weight, Pulse, Behavior reviewed: wnl Obesity/Abnormal weight gain:  pt reports seeing [facility] in past and started on Saxenda, took for a week but states she could not remember to take daily. Switched to 09-24-1992 but she had really bad reflux and nausea so had to stop this after increasing to 0.5mg  dose. Pt has Saxenda and was advised to start last visit, but she didn't take b/c it said to throw away after 30d, she didn't know it meant just the one syringe, not all of them. Anxiety/Depression:  pt reports many year history of anxiety and has failed many meds. She thinks she is very sensitive to medications as even small doses cause side effects: Ativan was most helpful but still caused SOB as well as Hydroxyzine as well as drowsiness even at 10mg  dose, Trazodone- drowsiness during day, Lexapro - anorgasmia. Taking Klonopin prn; Trintellix too expensive, sent Pristiq but pt hasn't started.   Assessment & Plan:   Problem List Items Addressed This Visit       Other   Generalized anxiety disorder    chronic pt called after last visit, Trintellix copay too $, switched to Pristiq 25mg   f/u 1 month      ADHD, adult residual type    chronic pt on Adderall 20mg  bid PDMP checked & verified sending refill f/u in 3 mos      Abnormal weight gain - Primary    chronic Ozempic caused bad sx still has box of Saxenda pens at home, advised to restart at 0.6mg  dose qd x 2w, then increase to 1.2mg  if tolerated advised on other wt loss strategies, good eating habits, increasing daily exercise, drinking 2L water  qd f/u in 1 month       Subjective:    Outpatient Medications Prior to Visit  Medication Sig Dispense Refill  . albuterol (VENTOLIN HFA) 108 (90 Base) MCG/ACT inhaler Inhale 2 puffs into the lungs every 6 (six) hours as needed for wheezing or shortness of breath. 8 g 5  . budesonide (PULMICORT) 0.5 MG/2ML nebulizer solution TAKE 2 ML (0.5 MG TOTAL) BY NEBULIZATION TWICE A DAY 360 mL 1  . budesonide-formoterol (SYMBICORT) 80-4.5 MCG/ACT inhaler Inhale 2 puffs into the lungs 2 (two) times daily. 1 each 2  . clonazePAM (KLONOPIN) 0.5 MG tablet Take 0.5 tablets (0.25 mg total) by mouth daily as needed for anxiety. 15 tablet 0  . desvenlafaxine (PRISTIQ) 25 MG 24 hr tablet Take 1 tablet (25 mg total) by mouth daily. 30 tablet 2  . diclofenac (VOLTAREN) 75 MG EC tablet Take 75 mg by mouth as needed for mild pain.    [employer] ibuprofen (ADVIL) 800 MG tablet Take 800 mg by mouth every 6 (six) hours as needed.    . Liraglutide -Weight Management (SAXENDA) 18 MG/3ML SOPN Inject 0.6 mg into the skin daily. 3 mL 0  . montelukast (SINGULAIR) 10 MG tablet Take 10 mg by mouth at bedtime.    omeprazole (PRILOSEC) 20 MG capsule Take 1 capsule (20 mg total) by mouth daily. 90 capsule 1  . ondansetron (ZOFRAN) 4  MG tablet Take 1 tablet (4 mg total) by mouth every 8 (eight) hours as needed for nausea or vomiting. 20 tablet 0  . Vitamin D, Ergocalciferol, (DRISDOL) 1.25 MG (50000 UNIT) CAPS capsule Take 50,000 Units by mouth every 7 (seven) days.    Marland Kitchen amphetamine-dextroamphetamine (ADDERALL) 20 MG tablet Take 1 tablet (20 mg total) by mouth daily before breakfast. 30 tablet 0  . amphetamine-dextroamphetamine (ADDERALL) 20 MG tablet Take 1 tablet (20 mg total) by mouth daily before breakfast. 30 tablet 0   No facility-administered medications prior to visit.   Past Medical History:  Diagnosis Date  . Anemia   . Anxiety   . Asthma   . Attention deficit disorder with hyperactivity 08/08/2014  . Atypical chest  pain 04/05/2011  . Bruising 03/24/2022  . Chronic anxiety 12/08/2020  . DDD (degenerative disc disease), lumbosacral   . Depression   . DOE (dyspnea on exertion) 03/21/2022  . Eustachian tube disorder 12/13/2010   10/1 IMO update  . GERD (gastroesophageal reflux disease)   . Insomnia 01/05/2021  . Lumbar back pain 10/17/2014   Formatting of this note might be different from the original. recurrent  . Moderate asthma with acute exacerbation 07/13/2022  . Other acne 10/20/2008   Formatting of this note might be different from the original. Acne  . Palpitations 03/21/2022  . Sleep apnea    Mild   History reviewed. No pertinent surgical history. No Known Allergies    Objective:    Physical Exam Vitals and nursing note reviewed.  Constitutional:      Appearance: Normal appearance. She is morbidly obese.  Cardiovascular:     Rate and Rhythm: Normal rate and regular rhythm.  Pulmonary:     Effort: Pulmonary effort is normal.     Breath sounds: Normal breath sounds.  Musculoskeletal:        General: Normal range of motion.  Skin:    General: Skin is warm and dry.  Neurological:     Mental Status: She is alert.  Psychiatric:        Mood and Affect: Mood normal.        Behavior: Behavior normal.   BP 117/62 (BP Location: Left Arm, Patient Position: Sitting, Cuff Size: Large)   Pulse 86   Temp 97.6 F (36.4 C) (Temporal)   Ht 5\' 3"  (1.6 m)   Wt (!) 319 lb (144.7 kg)   LMP 08/21/2022 (Exact Date)   SpO2 98%   BMI 56.51 kg/m  Wt Readings from Last 3 Encounters:  09/19/22 (!) 319 lb (144.7 kg)  08/31/22 (!) 315 lb (142.9 kg)  07/26/22 (!) 314 lb 12.8 oz (142.8 kg)      07/28/22, NP

## 2022-09-24 ENCOUNTER — Other Ambulatory Visit: Payer: Self-pay | Admitting: Family

## 2022-09-24 DIAGNOSIS — F411 Generalized anxiety disorder: Secondary | ICD-10-CM

## 2022-09-27 ENCOUNTER — Telehealth: Payer: Self-pay | Admitting: Neurology

## 2022-09-27 NOTE — Telephone Encounter (Signed)
Patient advised of Dr.Jaffe,   The most common side effect (if she has a side effect) would be headache, which is why we recommend that you lay down on your back for the rest of the day and keep hydrated.  There isn't any other test to definitively check for elevated spinal fluid pressure.

## 2022-09-27 NOTE — Telephone Encounter (Signed)
Pt called in stating she has some questions. She wants to find out what the risks are if she does the spinal tap. Can he diagnose IIH without doing a spinal tap?

## 2022-09-29 ENCOUNTER — Ambulatory Visit
Admission: RE | Admit: 2022-09-29 | Discharge: 2022-09-29 | Disposition: A | Discharge status: Home / Self Care | Payer: Federal, State, Local not specified - PPO | Source: Ambulatory Visit | Attending: Neurology | Admitting: Neurology

## 2022-09-29 ENCOUNTER — Other Ambulatory Visit (HOSPITAL_COMMUNITY)
Admission: RE | Admit: 2022-09-29 | Discharge: 2022-09-29 | Disposition: A | Discharge status: Home / Self Care | Payer: Federal, State, Local not specified - PPO | Source: Ambulatory Visit | Attending: Orthopedic Surgery | Admitting: Orthopedic Surgery

## 2022-09-29 VITALS — BP 128/62 | HR 77

## 2022-09-29 DIAGNOSIS — G932 Benign intracranial hypertension: Secondary | ICD-10-CM

## 2022-09-29 DIAGNOSIS — H538 Other visual disturbances: Secondary | ICD-10-CM | POA: Diagnosis not present

## 2022-09-29 DIAGNOSIS — R6889 Other general symptoms and signs: Secondary | ICD-10-CM | POA: Diagnosis not present

## 2022-09-29 DIAGNOSIS — R42 Dizziness and giddiness: Secondary | ICD-10-CM | POA: Diagnosis not present

## 2022-09-29 NOTE — Discharge Instructions (Signed)

## 2022-09-30 ENCOUNTER — Telehealth: Payer: Self-pay | Admitting: Neurology

## 2022-09-30 NOTE — Telephone Encounter (Signed)
Called patient and informed her of results and recommendations per Dr. Everlena Cooper. Patient would like to schedule a f/u appt with Dr. Everlena Cooper and is aware that I will have someone call her from the front to schedule a f/u with Dr. Everlena Cooper. Patient verbalized understanding and had no further questions or concerns.

## 2022-09-30 NOTE — Telephone Encounter (Signed)
Pt called in wanting to see if she could get her lumbar puncture results.

## 2022-09-30 NOTE — Telephone Encounter (Signed)
Spinal fluid labs are unremarkable (there is evidence of blood cells which is expected since she was stuck in the back with a needle).  Only culture for bacteria is still pending.  The opening pressure of the spinal fluid is borderline but not clearly elevated.  With the associated normal eye exam, I do not think she has increased intracranial pressure.  If she would like to discuss further, I would like her to schedule a follow up appointment.

## 2022-10-03 LAB — CSF CELL COUNT WITH DIFFERENTIAL
RBC Count, CSF: 700 cells/uL — ABNORMAL HIGH
TOTAL NUCLEATED CELL: 0 cells/uL (ref 0–5)

## 2022-10-03 LAB — CSF CULTURE W GRAM STAIN
MICRO NUMBER:: 14345528
Result:: NO GROWTH
SPECIMEN QUALITY:: ADEQUATE

## 2022-10-03 LAB — PROTEIN, CSF: Total Protein, CSF: 23 mg/dL (ref 15–45)

## 2022-10-03 LAB — GLUCOSE, CSF: Glucose, CSF: 59 mg/dL (ref 40–80)

## 2022-10-04 ENCOUNTER — Telehealth: Payer: Self-pay | Admitting: Neurology

## 2022-10-04 LAB — CYTOLOGY - NON PAP

## 2022-10-04 NOTE — Telephone Encounter (Signed)
[facility] called with Stat lab results. Full access nurse report is in Dr. Moises Blood box.

## 2022-10-04 NOTE — Telephone Encounter (Signed)
Already reviewed.  

## 2022-10-04 NOTE — Progress Notes (Unsigned)
NEUROLOGY FOLLOW UP OFFICE NOTE  Amy Kent [id number]  Assessment/Plan:   Various symptomatology including: - Head pressure - Dizziness - Blurred vision - Diffuse paroxysmal body pain - Paresthesias of bilateral lower extremities.  From my standpoint, extensive neurologic workup has been unremarkable - MRI of brain and cervical spine negative. - NCV-EMG and labs negative for neuropathy or muscle disease - I do not suspect idiopathic intracranial hypertension. She reports resolution of head pressure and blurred vision following the LP.  But eye exam was negative for papilledema.  This does not rule out IIH, so she underwent LP which revealed opening pressure of 21 cm H2O, which is normal for her body type.      Subjective:  Amy Kent is a 41 year old right-handed female with ADHD, anxiety, depression, OSA and lumbar back pain who follows up for dizziness and paresthesias and questionable idiopathic intracranial hypertension.  UPDATE: B12 on 07/26/2022 was 457.  She saw optometry on 08/04/2022.  Exam revealed no evidence of papilledema.  Underwent LP on 09/29/2022 which revealed opening pressure of 21 cm H2O. She says the severe pressure resolved after the spinal tap.  No longer having dizziness and vision changes.     She was seen in the ED on 08/31/2022 for a recurrent episode of left sided arm and hand numbness (particularly index and pinky fingers).  Intermittent for 4 days.   HISTORY: In February 2022, she developed a rash on her legs that subsequently resolved but then developed sensation of tremors involving her entire body, more of a vibratory sensation.  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  She was started on propranolol for presumed anxiety which seemed to initially help.  In December 2022, she began experiencing intermittent dull non-throbbing head pressure.  No nausea, vomiting, photophobia or phonophobia.  She  also started experiencing separate frequent episodes of dizziness, like a lightheadedness.  Occurs frequently.  On more rare occasion, she notes episodic blurred vision.Marland Kitchen  She was evaluated in the ED and by neurology.  CT head on 10/06/2021 was stable.  Labs, including CBC, CMP, TSH, COVID and Flu, were unremarkable.  MRI of brain with and without contrast on 10/20/2021 again revealed partially empty sella but otherwise unremarkable.  She describes paroxysmal shooting pain involving any part of her body, including the head.  Usually occurs if she is laying down and then moves her body.  Continues to have a vibrating sensation in her legs.  Also notes seeing muscle twitches on her legs.  No pain or weakness.  Mg from April 2023 was normal.  MRI of cervical spine on 10/23/2021 personally reviewed is unremarkable.  MRI of lumbar spine without contrast on 06/02/2022 showed mild degeneratve disc disease with type 1 Modic changes at L5-S1, Schmorl's node projecting through the inferior endplate of L5 and mild facet joint arthritis at L4-5 and L5-S1 but no spinal or foraminal stenosis.  NCV-EMG on 07/01/2022 was normal.  For dizziness/presyncope and palpitations, she was evaluated by cardiology.  Long-term cardiac monitoring was unremarkable.    PAST MEDICAL HISTORY: Past Medical History:  Diagnosis Date   Anemia    Anxiety    Asthma    Attention deficit disorder with hyperactivity 08/08/2014   Atypical chest pain 04/05/2011   Bruising 03/24/2022   Chronic anxiety 12/08/2020   DDD (degenerative disc disease), lumbosacral    Depression    DOE (dyspnea on exertion) 03/21/2022   Eustachian tube disorder 12/13/2010  10/1 IMO update   GERD (gastroesophageal reflux disease)    Insomnia 01/05/2021   Lumbar back pain 10/17/2014   Formatting of this note might be different from the original. recurrent   Moderate asthma with acute exacerbation 07/13/2022   Other acne 10/20/2008   Formatting of this note might be  different from the original. Acne   Palpitations 03/21/2022   Sleep apnea    Mild    MEDICATIONS: Current Outpatient Medications on File Prior to Visit  Medication Sig Dispense Refill   albuterol (VENTOLIN HFA) 108 (90 Base) MCG/ACT inhaler Inhale 2 puffs into the lungs every 6 (six) hours as needed for wheezing or shortness of breath. 8 g 5   [START ON 10/20/2022] amphetamine-dextroamphetamine (ADDERALL) 20 MG tablet Take 1 tablet (20 mg total) by mouth daily before breakfast. 30 tablet 0   amphetamine-dextroamphetamine (ADDERALL) 20 MG tablet Take 1 tablet (20 mg total) by mouth daily before breakfast. 30 tablet 0   budesonide (PULMICORT) 0.5 MG/2ML nebulizer solution TAKE 2 ML (0.5 MG TOTAL) BY NEBULIZATION TWICE A DAY 360 mL 1   budesonide-formoterol (SYMBICORT) 80-4.5 MCG/ACT inhaler Inhale 2 puffs into the lungs 2 (two) times daily. 1 each 2   clonazePAM (KLONOPIN) 0.5 MG tablet Take 0.5 tablets (0.25 mg total) by mouth daily as needed for anxiety. 15 tablet 2   desvenlafaxine (PRISTIQ) 25 MG 24 hr tablet Take 1 tablet (25 mg total) by mouth daily. 30 tablet 2   diclofenac (VOLTAREN) 75 MG EC tablet Take 75 mg by mouth as needed for mild pain.     ibuprofen (ADVIL) 800 MG tablet Take 800 mg by mouth every 6 (six) hours as needed.     Liraglutide -Weight Management (SAXENDA) 18 MG/3ML SOPN Inject 0.6 mg into the skin daily. 3 mL 0   montelukast (SINGULAIR) 10 MG tablet Take 10 mg by mouth at bedtime.     omeprazole (PRILOSEC) 20 MG capsule Take 1 capsule (20 mg total) by mouth daily. 90 capsule 1   ondansetron (ZOFRAN) 4 MG tablet Take 1 tablet (4 mg total) by mouth every 8 (eight) hours as needed for nausea or vomiting. 20 tablet 0   Vitamin D, Ergocalciferol, (DRISDOL) 1.25 MG (50000 UNIT) CAPS capsule Take 50,000 Units by mouth every 7 (seven) days.     No current facility-administered medications on file prior to visit.    ALLERGIES: No Known Allergies  FAMILY HISTORY: Family  History  Problem Relation Age of Onset   Diabetes Mother    Hypertension Mother    COPD Father       Objective:  Blood pressure 139/85, pulse 86, resp. rate 18, height 5\' 3"  (1.6 m), weight (!) 317 lb (143.8 kg), SpO2 96 %. General: No acute distress.  Patient appears well-groomed.   Head:  Normocephalic/atraumatic Eyes:  Fundi examined but not visualized Neck: supple, no paraspinal tenderness, full range of motion Heart:  Regular rate and rhythm Lungs:  Clear to auscultation bilaterally Back: No paraspinal tenderness Neurological Exam: alert and oriented to person, place, and time.  Speech fluent and not dysarthric, language intact.  CN II-XII intact. Bulk and tone normal, muscle strength 5/5 throughout.  Sensation to light touch intact.  Deep tendon reflexes 2+ throughout, toes downgoing.  Finger to nose testing intact.  Gait normal, Romberg negative.   , DO  CC: Shon Millet, NP

## 2022-10-05 ENCOUNTER — Encounter: Payer: Self-pay | Admitting: Neurology

## 2022-10-05 ENCOUNTER — Ambulatory Visit: Payer: Federal, State, Local not specified - PPO | Admitting: Neurology

## 2022-10-05 VITALS — BP 139/85 | HR 86 | Resp 18 | Ht 63.0 in | Wt 317.0 lb

## 2022-10-05 DIAGNOSIS — R519 Headache, unspecified: Secondary | ICD-10-CM

## 2022-10-05 DIAGNOSIS — R42 Dizziness and giddiness: Secondary | ICD-10-CM

## 2022-10-05 DIAGNOSIS — H538 Other visual disturbances: Secondary | ICD-10-CM

## 2022-10-05 DIAGNOSIS — R202 Paresthesia of skin: Secondary | ICD-10-CM

## 2022-10-06 DIAGNOSIS — F411 Generalized anxiety disorder: Secondary | ICD-10-CM | POA: Diagnosis not present

## 2022-10-06 DIAGNOSIS — F3132 Bipolar disorder, current episode depressed, moderate: Secondary | ICD-10-CM | POA: Diagnosis not present

## 2022-10-07 DIAGNOSIS — M5386 Other specified dorsopathies, lumbar region: Secondary | ICD-10-CM | POA: Diagnosis not present

## 2022-10-07 DIAGNOSIS — M9904 Segmental and somatic dysfunction of sacral region: Secondary | ICD-10-CM | POA: Diagnosis not present

## 2022-10-07 DIAGNOSIS — M9902 Segmental and somatic dysfunction of thoracic region: Secondary | ICD-10-CM | POA: Diagnosis not present

## 2022-10-07 DIAGNOSIS — M9903 Segmental and somatic dysfunction of lumbar region: Secondary | ICD-10-CM | POA: Diagnosis not present

## 2022-10-08 DIAGNOSIS — R42 Dizziness and giddiness: Secondary | ICD-10-CM | POA: Diagnosis not present

## 2022-10-10 ENCOUNTER — Encounter (HOSPITAL_BASED_OUTPATIENT_CLINIC_OR_DEPARTMENT_OTHER): Payer: Self-pay | Admitting: Pediatrics

## 2022-10-10 ENCOUNTER — Other Ambulatory Visit: Payer: Self-pay

## 2022-10-10 ENCOUNTER — Emergency Department (HOSPITAL_BASED_OUTPATIENT_CLINIC_OR_DEPARTMENT_OTHER)
Admission: EM | Admit: 2022-10-10 | Discharge: 2022-10-10 | Disposition: A | Discharge status: Home / Self Care | Payer: Federal, State, Local not specified - PPO | Attending: Emergency Medicine | Admitting: Emergency Medicine

## 2022-10-10 DIAGNOSIS — F419 Anxiety disorder, unspecified: Secondary | ICD-10-CM | POA: Diagnosis not present

## 2022-10-10 DIAGNOSIS — R42 Dizziness and giddiness: Secondary | ICD-10-CM | POA: Diagnosis not present

## 2022-10-10 LAB — CBG MONITORING, ED: Glucose-Capillary: 99 mg/dL (ref 70–99)

## 2022-10-10 NOTE — ED Triage Notes (Addendum)
C/O dizziness for a few days; "feels like I'm moving and falling" even when at rest sitting or laying; c/o blurred vision started earlier today. Reports the dizziness has been an ongoing issue for a year, on an off in nature. Reports a "whooshing" sensation on right ear when head is turned to the right side.

## 2022-10-10 NOTE — ED Provider Notes (Signed)
[facility] Provider Note   CSN: [id number] Arrival date & time: 10/10/22  1718     History  Chief Complaint  Patient presents with   Dizziness   Eye Problem    Amy Kent is a 42 y.o. female.  Pt c/o dizziness describe as room spinning sensation that is worse with certain head movements and positional changes. Symptoms present recurrently in past year.  Also notes sensation of congestion/fluid in ears. No tinnitus or hearing loss. No acute ear pain. No severe headaches. No neck pain or stiffness. Denies change in speech or vision. No numbness/weakness or change in normal function. No problems w gait. Drove self to ED without trouble. Has seen pcp, neurology and cardiology for same symptoms. Denies cough or sob. No fever or chills.  Was given rx for antivert, has filled but hasn't taken due to 'feeling anxious' about taking.   The history is provided by the patient and medical records.  Dizziness Associated symptoms: no chest pain, no headaches, no hearing loss, no nausea, no palpitations, no shortness of breath and no vomiting   Eye Problem Associated symptoms: no headaches, no nausea, no redness and no vomiting        Home Medications Prior to Admission medications   Medication Sig Start Date End Date Taking? Authorizing Provider  albuterol (VENTOLIN HFA) 108 (90 Base) MCG/ACT inhaler Inhale 2 puffs into the lungs every 6 (six) hours as needed for wheezing or shortness of breath. 08/19/22   Jeanie Sewer, NP  amphetamine-dextroamphetamine (ADDERALL) 20 MG tablet Take 1 tablet (20 mg total) by mouth daily before breakfast. 10/20/22 11/19/22  Jeanie Sewer, NP  amphetamine-dextroamphetamine (ADDERALL) 20 MG tablet Take 1 tablet (20 mg total) by mouth daily before breakfast. 09/19/22 10/19/22  Hudnell, Colletta Maryland, NP  budesonide (PULMICORT) 0.5 MG/2ML nebulizer solution TAKE 2 ML (0.5 MG TOTAL) BY NEBULIZATION TWICE A DAY 08/09/22   Hudnell,  Colletta Maryland, NP  budesonide-formoterol (SYMBICORT) 80-4.5 MCG/ACT inhaler Inhale 2 puffs into the lungs 2 (two) times daily. 07/13/22   Jeanie Sewer, NP  clonazePAM (KLONOPIN) 0.5 MG tablet Take 0.5 tablets (0.25 mg total) by mouth daily as needed for anxiety. 09/19/22   Jeanie Sewer, NP  desvenlafaxine (PRISTIQ) 25 MG 24 hr tablet Take 1 tablet (25 mg total) by mouth daily. 09/09/22   Jeanie Sewer, NP  diclofenac (VOLTAREN) 75 MG EC tablet Take 75 mg by mouth as needed for mild pain.    [provider]  ibuprofen (ADVIL) 800 MG tablet Take 800 mg by mouth every 6 (six) hours as needed. 05/28/22   [provider]  Liraglutide -Weight Management (SAXENDA) 18 MG/3ML SOPN Inject 0.6 mg into the skin daily. 06/20/22   Jeanie Sewer, NP  montelukast (SINGULAIR) 10 MG tablet Take 10 mg by mouth at bedtime.    [provider]  omeprazole (PRILOSEC) 20 MG capsule Take 1 capsule (20 mg total) by mouth daily. 06/20/22   Jeanie Sewer, NP  ondansetron (ZOFRAN) 4 MG tablet Take 1 tablet (4 mg total) by mouth every 8 (eight) hours as needed for nausea or vomiting. 06/20/22   Jeanie Sewer, NP  Vitamin D, Ergocalciferol, (DRISDOL) 1.25 MG (50000 UNIT) CAPS capsule Take 50,000 Units by mouth every 7 (seven) days.    [provider]      Allergies    Patient has no known allergies.    Review of Systems   Review of Systems  Constitutional:  Negative for chills and fever.  HENT:  Positive for congestion. Negative for ear pain, facial swelling, hearing loss and sore throat.   Eyes:  Negative for redness and visual disturbance.  Respiratory:  Negative for cough and shortness of breath.   Cardiovascular:  Negative for chest pain, palpitations and leg swelling.  Gastrointestinal:  Negative for abdominal pain, nausea and vomiting.  Genitourinary:  Negative for flank pain.  Musculoskeletal:  Negative for back pain and neck pain.  Skin:  Negative for  rash.  Neurological:  Positive for dizziness. Negative for headaches.  Hematological:  Does not bruise/bleed easily.  Psychiatric/Behavioral:  Negative for confusion.     Physical Exam Updated Vital Signs BP (!) 129/91   Pulse 76   Temp 98.8 F (37.1 C)   Resp 18   Ht 1.6 m (5\' 3" )   Wt (!) 142.9 kg   LMP 09/20/2022   SpO2 99%   BMI 55.80 kg/m  Physical Exam Vitals and nursing note reviewed.  Constitutional:      Appearance: Normal appearance. She is well-developed.  HENT:     Head: Atraumatic.     Right Ear: Tympanic membrane, ear canal and external ear normal.     Left Ear: Tympanic membrane, ear canal and external ear normal.     Nose: Nose normal.     Mouth/Throat:     Mouth: Mucous membranes are moist.  Eyes:     General: No scleral icterus.    Conjunctiva/sclera: Conjunctivae normal.     Pupils: Pupils are equal, round, and reactive to light.  Neck:     Vascular: No carotid bruit.     Trachea: No tracheal deviation.  Cardiovascular:     Rate and Rhythm: Normal rate and regular rhythm.     Pulses: Normal pulses.     Heart sounds: Normal heart sounds. No murmur heard.    No friction rub. No gallop.  Pulmonary:     Effort: Pulmonary effort is normal. No respiratory distress.     Breath sounds: Normal breath sounds.  Abdominal:     General: There is no distension.     Palpations: Abdomen is soft.     Tenderness: There is no abdominal tenderness.  Musculoskeletal:        General: No swelling.     Cervical back: Normal range of motion and neck supple. No rigidity. No muscular tenderness.     Right lower leg: No edema.     Left lower leg: No edema.  Skin:    General: Skin is warm and dry.     Findings: No rash.  Neurological:     General: No focal deficit present.     Mental Status: She is alert and oriented to person, place, and time.     Cranial Nerves: No cranial nerve deficit.     Comments: Alert, speech normal. No nystagmus noted. Motor/sens grossly  intact bil. Steady gait, no ataxia.   Psychiatric:        Mood and Affect: Mood normal.     ED Results / Procedures / Treatments   Labs (all labs ordered are listed, but only abnormal results are displayed) Results for orders placed or performed during the hospital encounter of 10/10/22  CBG monitoring, ED  Result Value Ref Range   Glucose-Capillary 99 70 - 99 mg/dL   Comment 1 Notify RN    DG FL GUIDED LUMBAR PUNCTURE  Result Date: 09/29/2022 CLINICAL DATA:  Head pressure, dizziness, blurred vision, paroxysmal body pain, and paresthesias. Evaluation for idiopathic intracranial hypertension.  EXAM: DIAGNOSTIC LUMBAR PUNCTURE UNDER FLUOROSCOPIC GUIDANCE COMPARISON:  None Available. FLUOROSCOPY: Radiation Exposure Index (as provided by the fluoroscopic device): 4.30 mGy Kerma PROCEDURE: Informed consent was obtained from the patient prior to the procedure, including potential complications of headache, allergy, and pain. With the patient prone, the lower back was prepped with Betadine. 1% Lidocaine was used for local anesthesia. Lumbar puncture was performed at the L2-3 level (designating the lowest lumbar type vertebra as L5) via a left interlaminar approach using a 6 inch 20 gauge needle with return of clear, colorless CSF with an opening pressure of 21 cm water (measured in the left lateral decubitus position). 22 mL of CSF were obtained for laboratory studies. Closing pressure was 17 cm water. The patient tolerated the procedure well and there were no apparent complications. IMPRESSION: Successful fluoroscopically guided lumbar puncture. Opening pressure of 21 cm water. Electronically Signed   By: Sebastian Ache M.D.   On: 09/29/2022 12:34    EKG EKG Interpretation  Date/Time:  Monday October 10 2022 17:30:48 EST Ventricular Rate:  89 PR Interval:  152 QRS Duration: 92 QT Interval:  369 QTC Calculation: 449 R Axis:   79 Text Interpretation: Sinus rhythm Confirmed by Cathren Laine ([id number])  on 10/10/2022 5:37:49 PM  Radiology No results found.  Procedures Procedures    Medications Ordered in ED Medications - No data to display  ED Course/ Medical Decision Making/ A&P                           Medical Decision Making Problems Addressed: Anxiety: chronic illness or injury with exacerbation, progression, or side effects of treatment Dizziness: acute illness or injury with systemic symptoms    Details: Acute on chronic  Amount and/or Complexity of Data Reviewed External Data Reviewed: labs, radiology and notes. Labs: ordered. Decision-making details documented in ED Course. Radiology: independent interpretation performed. Decision-making details documented in ED Course.  Risk OTC drugs.   Iv ns. Continuous pulse ox and cardiac monitoring. Labs ordered/sent.  Reviewed nursing notes and prior charts for additional history. External reports reviewed.  Recent note from neurology, Dr Everlena Cooper, on 12/27 summarizing multiple negative imaging studies, LP and other workup of same symptoms.  On review of constellation of multitude of prior symptoms, and neg studies, it appears likely that underlying somatization and anxiety issues are contributing to patients symptoms.   Cardiac monitor: sinus rhythm, rate 76.  Labs reviewed/interpreted by me - glucose normal.   Recent CT imaging reviewed/interpreted by me - no hem.   Pt indicates has new rx antivert.  I recommend she try antivert, +/- antihistamine/decongestant for symptom relief, and pcp f/u.  Pt currently appears stable for d/c.            Final Clinical Impression(s) / ED Diagnoses Final diagnoses:  None    Rx / DC Orders ED Discharge Orders     None         Cathren Laine, MD 10/10/22 [phone]

## 2022-10-10 NOTE — Discharge Instructions (Signed)
It was our pleasure to provide your ER care today - we hope that you feel better.  Drink plenty of fluids/stay well hydrated.  Try taking your antivert as need for symptom relief - no driving when taking or when feeling dizzy.  You may also try an antihistamine such as  zyrtec-d or claritin-d as need for symptom relief - these meds are available over the counter.    Follow up closely with primary care doctor in the next 1-2 weeks.   Return to ER if worse, new symptoms, high fevers, chest pain, trouble breathing, fainting, or other emergency concern.

## 2022-10-10 NOTE — ED Notes (Signed)
D/c paperwork reviewed with pt, including follow up care. Pt verbalized understanding, Ambulatory without assistance to ED exit, NAD.   

## 2022-10-11 DIAGNOSIS — M9903 Segmental and somatic dysfunction of lumbar region: Secondary | ICD-10-CM | POA: Diagnosis not present

## 2022-10-11 DIAGNOSIS — M9904 Segmental and somatic dysfunction of sacral region: Secondary | ICD-10-CM | POA: Diagnosis not present

## 2022-10-11 DIAGNOSIS — M5386 Other specified dorsopathies, lumbar region: Secondary | ICD-10-CM | POA: Diagnosis not present

## 2022-10-11 DIAGNOSIS — M9902 Segmental and somatic dysfunction of thoracic region: Secondary | ICD-10-CM | POA: Diagnosis not present

## 2022-10-13 DIAGNOSIS — M5386 Other specified dorsopathies, lumbar region: Secondary | ICD-10-CM | POA: Diagnosis not present

## 2022-10-13 DIAGNOSIS — M9902 Segmental and somatic dysfunction of thoracic region: Secondary | ICD-10-CM | POA: Diagnosis not present

## 2022-10-13 DIAGNOSIS — M9903 Segmental and somatic dysfunction of lumbar region: Secondary | ICD-10-CM | POA: Diagnosis not present

## 2022-10-13 DIAGNOSIS — M9904 Segmental and somatic dysfunction of sacral region: Secondary | ICD-10-CM | POA: Diagnosis not present

## 2022-10-14 ENCOUNTER — Inpatient Hospital Stay: Admission: RE | Admit: 2022-10-14 | Payer: Federal, State, Local not specified - PPO | Source: Ambulatory Visit

## 2022-10-14 DIAGNOSIS — R42 Dizziness and giddiness: Secondary | ICD-10-CM | POA: Diagnosis not present

## 2022-10-14 DIAGNOSIS — R55 Syncope and collapse: Secondary | ICD-10-CM | POA: Diagnosis not present

## 2022-10-15 DIAGNOSIS — R55 Syncope and collapse: Secondary | ICD-10-CM | POA: Diagnosis not present

## 2022-10-15 DIAGNOSIS — F05 Delirium due to known physiological condition: Secondary | ICD-10-CM | POA: Diagnosis not present

## 2022-10-15 DIAGNOSIS — R42 Dizziness and giddiness: Secondary | ICD-10-CM | POA: Diagnosis not present

## 2022-10-18 DIAGNOSIS — R202 Paresthesia of skin: Secondary | ICD-10-CM | POA: Diagnosis not present

## 2022-10-18 DIAGNOSIS — G43019 Migraine without aura, intractable, without status migrainosus: Secondary | ICD-10-CM | POA: Diagnosis not present

## 2022-10-18 DIAGNOSIS — H938X3 Other specified disorders of ear, bilateral: Secondary | ICD-10-CM | POA: Diagnosis not present

## 2022-10-20 ENCOUNTER — Telehealth: Payer: Self-pay | Admitting: Family

## 2022-10-20 ENCOUNTER — Encounter: Payer: Self-pay | Admitting: Internal Medicine

## 2022-10-20 ENCOUNTER — Ambulatory Visit (INDEPENDENT_AMBULATORY_CARE_PROVIDER_SITE_OTHER): Payer: Federal, State, Local not specified - PPO | Admitting: Internal Medicine

## 2022-10-20 VITALS — BP 137/81 | HR 92 | Temp 98.4°F | Ht 63.0 in | Wt 323.2 lb

## 2022-10-20 DIAGNOSIS — R5383 Other fatigue: Secondary | ICD-10-CM

## 2022-10-20 DIAGNOSIS — R002 Palpitations: Secondary | ICD-10-CM | POA: Diagnosis not present

## 2022-10-20 DIAGNOSIS — R635 Abnormal weight gain: Secondary | ICD-10-CM

## 2022-10-20 DIAGNOSIS — F411 Generalized anxiety disorder: Secondary | ICD-10-CM | POA: Diagnosis not present

## 2022-10-20 DIAGNOSIS — R079 Chest pain, unspecified: Secondary | ICD-10-CM

## 2022-10-20 DIAGNOSIS — F3132 Bipolar disorder, current episode depressed, moderate: Secondary | ICD-10-CM | POA: Diagnosis not present

## 2022-10-20 LAB — COMPREHENSIVE METABOLIC PANEL
ALT: 10 U/L (ref 0–35)
AST: 12 U/L (ref 0–37)
Albumin: 4.1 g/dL (ref 3.5–5.2)
Alkaline Phosphatase: 70 U/L (ref 39–117)
BUN: 15 mg/dL (ref 6–23)
CO2: 28 mEq/L (ref 19–32)
Calcium: 8.9 mg/dL (ref 8.4–10.5)
Chloride: 104 mEq/L (ref 96–112)
Creatinine, Ser: 0.86 mg/dL (ref 0.40–1.20)
GFR: 85.19 mL/min (ref >60.00)
Glucose, Bld: 83 mg/dL (ref 70–99)
Potassium: 4.2 mEq/L (ref 3.5–5.1)
Sodium: 138 mEq/L (ref 135–145)
Total Bilirubin: 0.3 mg/dL (ref 0.2–1.2)
Total Protein: 7.2 g/dL (ref 6.0–8.3)

## 2022-10-20 LAB — CBC
HCT: 38 % (ref 36.0–46.0)
Hemoglobin: 12.3 g/dL (ref 12.0–15.0)
MCHC: 32.4 g/dL (ref 30.0–36.0)
MCV: 82.5 fl (ref 78.0–100.0)
Platelets: 348 10*3/uL (ref 150.0–400.0)
RBC: 4.6 Mil/uL (ref 3.87–5.11)
RDW: 15 % (ref 11.5–15.5)
WBC: 10.3 10*3/uL (ref 4.0–10.5)

## 2022-10-20 LAB — TSH: TSH: 1.45 u[IU]/mL (ref 0.35–5.50)

## 2022-10-20 LAB — CORTISOL: Cortisol, Plasma: 9.4 ug/dL

## 2022-10-20 LAB — VITAMIN D 25 HYDROXY (VIT D DEFICIENCY, FRACTURES): VITD: 14.39 ng/mL — ABNORMAL LOW (ref 30.00–100.00)

## 2022-10-20 LAB — TESTOSTERONE: Testosterone: 20.89 ng/dL (ref 15.00–40.00)

## 2022-10-20 NOTE — Telephone Encounter (Signed)
Error

## 2022-10-20 NOTE — Progress Notes (Signed)
[facility]: [phone]   Routine Medical Office Visit  Patient:  Amy Kent      Age: 42 y.o.       Sex:  female  Date:   10/20/2022  PCP:    Dulce Sellar, NP   Today's Healthcare Provider: Lula Olszewski, MD   Problem Focused Charting:   Medical Decision Making per Assessment/Plan   Today was an acute visit for palpitations, chest discomfort and dizziness in context of numerous other somatic complaints and long term sense of anxiety about  feb 2022 changes in health that have led to abnormal weight gain.   Amy Kent was seen today for chest discomfort, dizziness, possible low iron and vit d, fatigue and palpitations.  Abnormal weight gain -     ACTH -     Cortisol -     US PELVIC COMPLETE WITH TRANSVAGINAL; Future -     FSH/LH -     Testosterone  Chest pain, unspecified type -     EKG 12-Lead  Other fatigue -     VITAMIN D 25 Hydroxy (Vit-D Deficiency, Fractures)  Palpitations -     TSH -     CBC -     Comprehensive metabolic panel -     HOLTER MONITOR - 48 HOUR; Future -     Iron, TIBC and Ferritin Panel -     IBC + Ferritin   A detailed hpi and chart review was conducted after reviewing negative EKG.   This data was then used as prompt to develop the following artificial intelligence generated summary and plan (in red), which was reviewed with the patient.   Comprehensive Review of the Case: The patient is a 42 year old with a complex medical history including anxiety, sciatica, paresthesias, degenerative disk disease, iron deficiency requiring transfusions, empty sella syndrome, abnormal weight gain, severe anxiety with panic, mild asthma, and heavy menstrual periods. The patient's only medication is intermittent use of low-dose Klonopin. They have been experiencing intermittent chest pain, palpitations, dizziness, and a sensation of heartbeats in the pectoralis major tendon. The palpitations disrupt sleep, and the patient sometimes  wakes up with dizziness. The patient has a history of syncope, now presenting as presyncope/dizziness. They have twitches in the legs and arms, and a prior Holter monitor was negative. A recent spinal tap for head pressure showed an opening pressure of 21, which relieved symptoms upon CSF removal. The patient has a high BMI of 57 and does not always use their CPAP machine, sometimes waking with severe palpitations. An echocardiogram 18 months prior was negative. Laboratory data shows normal B12 and a negative urine drug screen. The patient has no known allergies. The physical examination revealed normal work of breathing and heart sounds. There is no detailed social history, medication list beyond Klonopin, or course of illness provided.  Most Likely Dx:  Idiopathic Intracranial Hypertension (IIH): The patient's symptoms of intermittent chest pain, palpitations, dizziness, and head pressure that was relieved by CSF removal, along with an opening pressure of 21 on spinal tap, suggest IIH. This condition is more common in individuals with obesity and can present with symptoms of increased intracranial pressure, such as headache and visual disturbances, which could be interpreted as dizziness or presyncope. The empty sella is also a common finding in IIH. The presence of papilledema on fundoscopic examination could further suggest this diagnosis.  Expanded DDx:  Cardiomyopathy: Given the patient's high BMI and symptoms of chest pain, palpitations, and presyncope, a form of  cardiomyopathy, such as obesity cardiomyopathy, could be considered. This condition can lead to heart failure and arrhythmias, which could explain the patient's cardiac symptoms. The presence of abnormal findings on echocardiogram or cardiac MRI could further suggest this diagnosis.  Sleep Apnea: The patient's obesity, non-compliance with CPAP, and symptoms of waking up with severe palpitations suggest obstructive sleep apnea (OSA). OSA can  cause nocturnal arrhythmias and daytime symptoms of fatigue and dizziness due to disrupted sleep and intermittent hypoxia. A sleep study showing apneic episodes could further suggest this diagnosis.  Alternative DDx:  Polycystic Ovary Syndrome (PCOS): The patient's heavy menstrual periods, abnormal weight gain, and absence of hirsutism could suggest PCOS, which can also be associated with metabolic syndrome and an increased risk of cardiovascular issues. The presence of polycystic ovaries on ultrasound and elevated androgen levels could further suggest this diagnosis.  Iron Deficiency Anemia: The patient's history of iron deficiency requiring transfusions could explain symptoms of dizziness and palpitations due to anemia. The presence of low hemoglobin, hematocrit, and ferritin levels could further suggest this diagnosis.  Fibromyalgia: The widespread paresthesias, twitches, and history of anxiety and panic could be consistent with fibromyalgia, a condition characterized by widespread musculoskeletal pain and fatigue. The presence of tender points on physical examination could further suggest this diagnosis.  Multiple Sclerosis (MS): The patient's symptoms of paresthesias, twitches, and a history of burning and numbing tingling in the left hand could suggest MS. The presence of demyelinating lesions on MRI could further suggest this diagnosis.  Myasthenia Gravis (MG): The patient's symptoms of muscle twitches and intermittent symptoms could suggest MG, an autoimmune neuromuscular disorder. The presence of acetylcholine receptor antibodies and a positive response to edrophonium test could further suggest this diagnosis.  Thyroid Dysfunction: Given the patient's abnormal weight gain and menstrual irregularities, thyroid dysfunction such as hypothyroidism could be considered. The presence of abnormal thyroid function tests could further suggest this diagnosis.  Adrenal Insufficiency: The patient's  symptoms of fatigue, dizziness, and anxiety, along with a history of iron deficiency anemia, could suggest adrenal insufficiency. The presence of low serum cortisol and abnormal adrenocorticotropic hormone (ACTH) stimulation test could further suggest this diagnosis.  Cushing's Syndrome: The patient's obesity, abnormal weight gain, and menstrual irregularities could suggest Cushing's syndrome. The presence of elevated cortisol levels and abnormal dexamethasone suppression test could further suggest this diagnosis.   #Idiopathic Intracranial Hypertension  The patient presents with a complex medical history, including symptoms that are suggestive of idiopathic intracranial hypertension (IIH), such as intermittent chest pain, palpitations, dizziness, and a history of head pressure that was relieved by cerebrospinal fluid (CSF) removal. The opening pressure of 21 on spinal tap is indicative of elevated intracranial pressure, which is consistent with IIH. The patient's high body mass index (BMI) and the presence of an empty sella syndrome are also supportive of this diagnosis. The negative Holter monitor and echocardiogram from 18 months prior make cardiac causes less likely, although not entirely ruled out. The patient's non-compliance with CPAP use and symptoms upon waking could suggest sleep apnea contributing to their symptoms. The differential diagnosis includes idiopathic intracranial hypertension, cardiomyopathy, sleep apnea, polycystic ovary syndrome, iron deficiency anemia, fibromyalgia, multiple sclerosis, myasthenia gravis, thyroid dysfunction, adrenal insufficiency, and Cushing's syndrome.  Dx: - Repeat spinal tap with measurement of opening pressure to assess for persistent elevated intracranial pressure. - Fundoscopic examination to check for papilledema. - MRI of the brain with and without contrast to evaluate for structural causes and signs consistent with IIH. - Polysomnography to  evaluate  for obstructive sleep apnea. - Echocardiogram to reassess cardiac structure and function, given the patient's high BMI and symptoms. - Complete blood count and iron studies to evaluate for iron deficiency anemia. - Hormonal panel including thyroid function tests, serum cortisol, and ACTH stimulation test to assess for thyroid dysfunction and adrenal insufficiency. - Ultrasound of the ovaries and hormonal assays (LH, FSH, testosterone) to evaluate for polycystic ovary syndrome. - Neurological evaluation including MRI of the cervical spine and brain to assess for multiple sclerosis. - Acetylcholine receptor antibody test and edrophonium test for myasthenia gravis. - Dexamethasone suppression test to evaluate for Cushing's syndrome.  Tx: - Weight management and lifestyle modification to reduce BMI. - Regular use of CPAP machine for sleep apnea management, if diagnosed. - Acetazolamide or topiramate for reduction of CSF production if IIH is confirmed. - Therapeutic lumbar puncture for symptom relief if symptoms of elevated intracranial pressure persist. - Iron supplementation if iron deficiency anemia is confirmed. - Appropriate hormonal therapy if PCOS, thyroid dysfunction, or adrenal insufficiency is diagnosed. - Symptomatic treatment for anxiety and panic, potentially reviewing the use of Klonopin and considering alternative or additional anxiolytics or antidepressants. - Referral to a neurologist for further evaluation and management of neurological symptoms. - Cardiology referral for management of any identified cardiomyopathy or arrhythmias.   I advised her that she should follow up with Primary Care Provider (PCP) regularly and ordered a lot of the testing that's suggested by the artificial intelligence. She already has follow up planned with neurologist and cardiologist   Medical decision making 1 or more chronic illnesses with exacerbation,  progression, or side effects of  treatment Tests, documents, or independent historian(s). Any combination of 3 from the following:  Review of prior external note(s) from each unique source;  Review of the result(s) of each uniquetest;  Ordering of each unique test;  Assessment requiring an independent historian(s)   Subjective - Clinical Presentation:   Jahayra Mazo is a 42 y.o. female  Patient Active Problem List   Diagnosis Date Noted   Peripheral neuralgia 07/13/2022   ADHD, adult residual type 06/20/2022   Gastroesophageal reflux disease without esophagitis 06/20/2022   Abnormal weight gain 06/20/2022   Generalized anxiety disorder 05/27/2022   DDD (degenerative disc disease), lumbosacral 05/27/2022   Iron deficiency anemia due to chronic blood loss 03/24/2022   Empty sella turcica (HCC) 03/15/2022   Vitamin D deficiency 01/25/2021   Severe anxiety with panic 11/22/2020   Mild persistent asthma without complication 11/16/2018   Morbid obesity (HCC) 01/13/2017   Low grade squamous intraepithelial lesion on cytologic smear of cervix (LGSIL) 08/28/2014   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   Family history of coronary artery disease 04/05/2011   Migraine headache 10/08/2010   Major depression 09/10/2010   Past Medical History:  Diagnosis Date   Anemia    Anxiety    Asthma    Attention deficit disorder with hyperactivity 08/08/2014   Atypical chest pain 04/05/2011   Bruising 03/24/2022   Chronic anxiety 12/08/2020   DDD (degenerative disc disease), lumbosacral    Depression    DOE (dyspnea on exertion) 03/21/2022   Eustachian tube disorder 12/13/2010   10/1 IMO update   GERD (gastroesophageal reflux disease)    Insomnia 01/05/2021   Lumbar back pain 10/17/2014   Formatting of this note might be different from the original. recurrent   Moderate asthma with acute exacerbation 07/13/2022   Other acne 10/20/2008   Formatting of this note might  be different from the original. Acne    Palpitations 03/21/2022   Sleep apnea    Mild    Outpatient Medications Prior to Visit  Medication Sig   albuterol (VENTOLIN HFA) 108 (90 Base) MCG/ACT inhaler Inhale 2 puffs into the lungs every 6 (six) hours as needed for wheezing or shortness of breath.   amphetamine-dextroamphetamine (ADDERALL) 20 MG tablet Take 1 tablet (20 mg total) by mouth daily before breakfast.   budesonide (PULMICORT) 0.5 MG/2ML nebulizer solution TAKE 2 ML (0.5 MG TOTAL) BY NEBULIZATION TWICE A DAY   budesonide-formoterol (SYMBICORT) 80-4.5 MCG/ACT inhaler Inhale 2 puffs into the lungs 2 (two) times daily.   clonazePAM (KLONOPIN) 0.5 MG tablet Take 0.5 tablets (0.25 mg total) by mouth daily as needed for anxiety.   desvenlafaxine (PRISTIQ) 25 MG 24 hr tablet Take 1 tablet (25 mg total) by mouth daily.   diclofenac (VOLTAREN) 75 MG EC tablet Take 75 mg by mouth as needed for mild pain.   ibuprofen (ADVIL) 800 MG tablet Take 800 mg by mouth every 6 (six) hours as needed.   Liraglutide -Weight Management (SAXENDA) 18 MG/3ML SOPN Inject 0.6 mg into the skin daily.   montelukast (SINGULAIR) 10 MG tablet Take 10 mg by mouth at bedtime.   omeprazole (PRILOSEC) 20 MG capsule Take 1 capsule (20 mg total) by mouth daily.   ondansetron (ZOFRAN) 4 MG tablet Take 1 tablet (4 mg total) by mouth every 8 (eight) hours as needed for nausea or vomiting.   Vitamin D, Ergocalciferol, (DRISDOL) 1.25 MG (50000 UNIT) CAPS capsule Take 50,000 Units by mouth every 7 (seven) days.   amphetamine-dextroamphetamine (ADDERALL) 20 MG tablet Take 1 tablet (20 mg total) by mouth daily before breakfast.   No facility-administered medications prior to visit.    Chief Complaint  Patient presents with   Chest discomfort    For three day-somewhat of a pain that goes away when she moves.   Dizziness    For a month.   Possible low iron and vit D   Fatigue   Palpitations    HPI  Patient reports chest pain starting a few days ago Intermittent  Feels like it moved down in sternum early today - non exertional, associated with left breast tenderness Associated with palpitations and dizziness.  Palpitations make it hard to sleep due to chest is booming.  Also wakes up from sleep with dizziness. Complaining of heart beat sensation in pectoralis major tendon left  Patient reports "Think my iron and vitamin D have dropped, had to get infusions last summer She has a lot of anxiety - takes intermittent Klonopin. Didn't like xanax and lorazepam. No other medications  Since Nov 14 2020- woke up with sciatica - feels like this is when a whole host of health problems started such as paresthesias all over, twitches all over body. Patient Nov 13, 2020, patient reports pens and needles all over along with severe headache worst of life. Also had big ? Bug bites on legs like a histamine response.  Lyme test diseas later neg Saw neurosurgery for left arm burning and numbing tingling left had and had emg- cause unclear but chiropractor said degenerative disk disease. Has cardiology appointment tomorrow. Prior Holter was negative.  Echocardiogram 18 month(s) ago was negative, was done for syncope, which went away for a while but now is returning as presyncope/dizziness for past month. EKG today norma Recent spinal tap from neurology for head pressure - which relieved with csf removal. Opening pressure  on the spinal tap was 21 Vital signs normal except weight which is 323 corresponding to BMI of 57.  Endorses that she doesn't always wear CPAP and sometimes wakes up with severe palpitations Past medical history significant for empty sella on imaging, abnormal weight gain, iron deficiency requiring transfusions, severe paresthesias all over, severe anxiety with panic, mild asthma. Normal work of breathing and heart sounds today She has heavy periods no hirsutism no pcos diagnosis on chart - never had pelvic ultrasound  B12 and urine drug screen on chart in past were  normal.  Self reports borderline personality disorder, not doing counseling.          Objective:  Physical Exam  BP 137/81 (BP Location: Left Arm, Patient Position: Sitting)   Pulse 92   Temp 98.4 F (36.9 C) (Temporal)   Ht 5\' 3"  (1.6 m)   Wt (!) 323 lb 3.2 oz (146.6 kg)   LMP 09/20/2022   SpO2 99%   BMI 57.25 kg/m  Severely obese  by BMI criteria but truncal adiposity (waist circumference or caliper) should be used instead. Wt Readings from Last 10 Encounters:  10/20/22 (!) 323 lb 3.2 oz (146.6 kg)  10/10/22 (!) 315 lb (142.9 kg)  10/05/22 (!) 317 lb (143.8 kg)  09/19/22 (!) 319 lb (144.7 kg)  08/31/22 (!) 315 lb (142.9 kg)  07/26/22 (!) 314 lb 12.8 oz (142.8 kg)  07/13/22 (!) 310 lb (140.6 kg)  07/13/22 (!) 312 lb 2 oz (141.6 kg)  06/20/22 (!) 318 lb (144.2 kg)  06/02/22 (!) 305 lb (138.3 kg)   Vital signs reviewed.  Nursing notes reviewed. Weight trend reviewed. General Appearance:  Well developed, well nourished female in no acute distress.   Normal work of breathing at rest Musculoskeletal: All extremities are intact.  Neurological:  Awake, alert,  No obvious focal neurological deficits or cognitive impairments Psychiatric:  Appropriate mood, pleasant demeanor Problem-specific findings:  anxious-   Results Reviewed: Results for orders placed or performed in visit on 10/20/22  Vitamin D (25 hydroxy)  Result Value Ref Range   VITD 14.39 (L) 30.00 - 100.00 ng/mL  TSH  Result Value Ref Range   TSH 1.45 0.35 - 5.50 uIU/mL  CBC  Result Value Ref Range   WBC 10.3 4.0 - 10.5 K/uL   RBC 4.60 3.87 - 5.11 Mil/uL   Platelets 348.0 150.0 - 400.0 K/uL   Hemoglobin 12.3 12.0 - 15.0 g/dL   HCT 38.0 36.0 - 46.0 %   MCV 82.5 78.0 - 100.0 fl   MCHC 32.4 30.0 - 36.0 g/dL   RDW 15.0 11.5 - 15.5 %  Comp Met (CMET)  Result Value Ref Range   Sodium 138 135 - 145 mEq/L   Potassium 4.2 3.5 - 5.1 mEq/L   Chloride 104 96 - 112 mEq/L   CO2 28 19 - 32 mEq/L   Glucose, Bld 83  70 - 99 mg/dL   BUN 15 6 - 23 mg/dL   Creatinine, Ser 0.86 0.40 - 1.20 mg/dL   Total Bilirubin 0.3 0.2 - 1.2 mg/dL   Alkaline Phosphatase 70 39 - 117 U/L   AST 12 0 - 37 U/L   ALT 10 0 - 35 U/L   Total Protein 7.2 6.0 - 8.3 g/dL   Albumin 4.1 3.5 - 5.2 g/dL   GFR 85.19 >60.00 mL/min   Calcium 8.9 8.4 - 10.5 mg/dL  Cortisol  Result Value Ref Range   Cortisol, Plasma 9.4 ug/dL  Testosterone  Result Value Ref Range   Testosterone 20.89 15.00 - 40.00 ng/dL    Recent Results (from the past 2160 hour(s))  Vitamin B12     Status: None   Collection Time: 07/26/22  8:39 AM  Result Value Ref Range   Vitamin B-12 457 211 - 911 pg/mL  Comprehensive metabolic panel     Status: Abnormal   Collection Time: 08/31/22  3:51 PM  Result Value Ref Range   Sodium 139 135 - 145 mmol/L   Potassium 3.9 3.5 - 5.1 mmol/L   Chloride 105 98 - 111 mmol/L   CO2 27 22 - 32 mmol/L   Glucose, Bld 121 (H) 70 - 99 mg/dL    Comment: Glucose reference range applies only to samples taken after fasting for at least 8 hours.   BUN 14 6 - 20 mg/dL   Creatinine, Ser 0.96 0.44 - 1.00 mg/dL   Calcium 8.8 (L) 8.9 - 10.3 mg/dL   Total Protein 7.8 6.5 - 8.1 g/dL   Albumin 3.7 3.5 - 5.0 g/dL   AST 21 15 - 41 U/L   ALT 14 0 - 44 U/L   Alkaline Phosphatase 77 38 - 126 U/L   Total Bilirubin 0.3 0.3 - 1.2 mg/dL   GFR, Estimated >60 >60 mL/min    Comment: (NOTE) Calculated using the CKD-EPI Creatinine Equation (2021)    Anion gap 7 5 - 15    Comment: Performed at [facility], [address] [place], [place] [address]  CBC with Differential     Status: None   Collection Time: 08/31/22  3:51 PM  Result Value Ref Range   WBC 10.1 4.0 - 10.5 K/uL   RBC 4.66 3.87 - 5.11 MIL/uL   Hemoglobin 12.4 12.0 - 15.0 g/dL   HCT 39.3 36.0 - 46.0 %   MCV 84.3 80.0 - 100.0 fL   MCH 26.6 26.0 - 34.0 pg   MCHC 31.6 30.0 - 36.0 g/dL   RDW 14.6 11.5 - 15.5 %   Platelets 352 150 - 400 K/uL   nRBC 0.0 0.0 - 0.2 %    Neutrophils Relative % 66 %   Neutro Abs 6.7 1.7 - 7.7 K/uL   Lymphocytes Relative 21 %   Lymphs Abs 2.1 0.7 - 4.0 K/uL   Monocytes Relative 9 %   Monocytes Absolute 0.9 0.1 - 1.0 K/uL   Eosinophils Relative 3 %   Eosinophils Absolute 0.3 0.0 - 0.5 K/uL   Basophils Relative 1 %   Basophils Absolute 0.1 0.0 - 0.1 K/uL   Immature Granulocytes 0 %   Abs Immature Granulocytes 0.02 0.00 - 0.07 K/uL    Comment: Performed at [facility], [address] [place], [place] [address]  Cytology - non pap     Status: None   Collection Time: 09/29/22 11:58 AM  Result Value Ref Range   CYTOLOGY - NON GYN      CYTOLOGY - NON PAP CASE: [id number] PATIENT: Lynnae Prude Non-Gynecological Cytology Report     Clinical History: None provided Specimen Submitted:  A. CEREBRAL SPINAL FLUID, SPINAL TAP:   FINAL MICROSCOPIC DIAGNOSIS: - No malignant cells identified  SPECIMEN ADEQUACY: Satisfactory for evaluation  GROSS: Received is/are 2cc's of clear colorless fluid.(Tb:tb) Smears: 0 Concentration Method (Thin Prep):1 Cell Block: 0 Additional Studies: NA     Final Diagnosis performed by Mark Martinique, MD.   Electronically signed 10/04/2022 Technical and / or Professional components performed at [facility].  [facility], [address], [place], [place] [address].  Immunohistochemistry Technical component (if applicable) was performed at [facility]. [address], [place], [place] [address].   IMMUNOHISTOCHEMISTRY DISCLAIMER (if applicable): Some of these immunohistochemical stains may have been developed and the performance char acteristics determine by [facility]. Some may not have been cleared or approved by the U.S. Food and Drug Administration. The FDA has determined that such clearance or approval is not necessary. This test is used for clinical purposes. It should not be regarded as investigational or for research.  This laboratory is certified under the Clinical Laboratory Improvement Amendments of 1988 (CLIA-88) as qualified to perform high complexity clinical laboratory testing.  The controls stained appropriately.   Glucose, CSF     Status: None   Collection Time: 09/29/22 12:03 PM  Result Value Ref Range   Glucose, CSF 59 40 - 80 mg/dL  CSF cell count with differential     Status: Abnormal   Collection Time: 09/29/22 12:03 PM  Result Value Ref Range   Color, CSF GRAY-WHITE (A) COLORLESS   Appearance, CSF CLEAR CLEAR   RBC Count, CSF 700 (H) 0 cells/uL   TOTAL NUCLEATED CELL 0 0 - 5 cells/uL   Monocyte/Macrophage CANCELED     Comment: Result canceled by the ancillary.  CSF culture     Status: None   Collection Time: 09/29/22 12:03 PM   Specimen: Lumbar Puncture; Cerebrospinal Fluid  Result Value Ref Range   MICRO NUMBER: [id number]    SPECIMEN QUALITY: Adequate    Source CEREBROSPINAL FLUID (CSF)    STATUS: FINAL    GRAM STAIN:      Gram stain prepared by cytospin No organisms or white blood cells seen   Result: No Growth   Protein, CSF     Status: None   Collection Time: 09/29/22 12:03 PM  Result Value Ref Range   Total Protein, CSF 23 15 - 45 mg/dL  CBG monitoring, ED     Status: None   Collection Time: 10/10/22  5:27 PM  Result Value Ref Range   Glucose-Capillary 99 70 - 99 mg/dL    Comment: Glucose reference range applies only to samples taken after fasting for at least 8 hours.   Comment 1 Notify RN   Vitamin D (25 hydroxy)     Status: Abnormal   Collection Time: 10/20/22  2:37 PM  Result Value Ref Range   VITD 14.39 (L) 30.00 - 100.00 ng/mL  TSH     Status: None   Collection Time: 10/20/22  2:37 PM  Result Value Ref Range   TSH 1.45 0.35 - 5.50 uIU/mL  CBC     Status: None   Collection Time: 10/20/22  2:37 PM  Result Value Ref Range   WBC 10.3 4.0 - 10.5 K/uL   RBC 4.60 3.87 - 5.11 Mil/uL   Platelets 348.0 150.0 - 400.0 K/uL   Hemoglobin 12.3 12.0 - 15.0 g/dL   HCT  78.438.0 69.636.0 - 29.546.0 %   MCV 82.5 78.0 - 100.0 fl   MCHC 32.4 30.0 - 36.0 g/dL   RDW 28.415.0 13.211.5 - 44.015.5 %  Comp Met (CMET)     Status: None   Collection Time: 10/20/22  2:37 PM  Result Value Ref Range   Sodium 138 135 - 145 mEq/L   Potassium 4.2 3.5 - 5.1 mEq/L   Chloride 104 96 - 112 mEq/L   CO2 28 19 - 32 mEq/L  Glucose, Bld 83 70 - 99 mg/dL   BUN 15 6 - 23 mg/dL   Creatinine, Ser 0.86 0.40 - 1.20 mg/dL   Total Bilirubin 0.3 0.2 - 1.2 mg/dL   Alkaline Phosphatase 70 39 - 117 U/L   AST 12 0 - 37 U/L   ALT 10 0 - 35 U/L   Total Protein 7.2 6.0 - 8.3 g/dL   Albumin 4.1 3.5 - 5.2 g/dL   GFR 57.84 >69.62 mL/min    Comment: Calculated using the CKD-EPI Creatinine Equation (2021)   Calcium 8.9 8.4 - 10.5 mg/dL  Cortisol     Status: None   Collection Time: 10/20/22  2:37 PM  Result Value Ref Range   Cortisol, Plasma 9.4 ug/dL    Comment: AM:  4.3 - 22.4 ug/dLPM:  3.1 - 16.7 ug/dL  Testosterone     Status: None   Collection Time: 10/20/22  2:37 PM  Result Value Ref Range   Testosterone 20.89 15.00 - 40.00 ng/dL     EKG: personally reviewed and interpreted sinus rhythm @ 92 with normal intervals and no  ST segment change . All intervals normal. ED ECG REPORT   Date: 10/20/2022  Rate: 92  Rhythm: normal sinus rhythm  QRS Axis: normal  Intervals: normal  ST/T Wave abnormalities: normal  Conduction Disutrbances:none  Narrative Interpretation:  I have personally reviewed the EKG tracing and agree with the computerized printout as noted.     Signed: Lula Olszewski, MD 10/20/2022 6:54 PM

## 2022-10-21 ENCOUNTER — Ambulatory Visit: Payer: Federal, State, Local not specified - PPO | Attending: Internal Medicine

## 2022-10-21 ENCOUNTER — Other Ambulatory Visit: Payer: Self-pay

## 2022-10-21 DIAGNOSIS — R7989 Other specified abnormal findings of blood chemistry: Secondary | ICD-10-CM

## 2022-10-21 DIAGNOSIS — E559 Vitamin D deficiency, unspecified: Secondary | ICD-10-CM | POA: Diagnosis not present

## 2022-10-21 DIAGNOSIS — R002 Palpitations: Secondary | ICD-10-CM

## 2022-10-21 DIAGNOSIS — D500 Iron deficiency anemia secondary to blood loss (chronic): Secondary | ICD-10-CM | POA: Diagnosis not present

## 2022-10-21 MED ORDER — VITAMIN D (ERGOCALCIFEROL) 1.25 MG (50000 UNIT) PO CAPS: 50000.0000 [IU] | ORAL_CAPSULE | ORAL | 3 refills | Status: DC

## 2022-10-21 NOTE — Telephone Encounter (Signed)
Pt states she needs this med ASAP, she is feeling really sluggish and no energy.

## 2022-10-21 NOTE — Progress Notes (Unsigned)
Enrolled patient for a 2-3 day Zio XT monitor to be mailed to patients home   DOD to read

## 2022-10-24 ENCOUNTER — Ambulatory Visit (HOSPITAL_COMMUNITY): Payer: Federal, State, Local not specified - PPO | Admitting: Psychiatry

## 2022-10-24 ENCOUNTER — Encounter: Payer: Self-pay | Admitting: Neurology

## 2022-10-24 DIAGNOSIS — F411 Generalized anxiety disorder: Secondary | ICD-10-CM

## 2022-10-24 DIAGNOSIS — R42 Dizziness and giddiness: Secondary | ICD-10-CM | POA: Diagnosis not present

## 2022-10-24 DIAGNOSIS — R55 Syncope and collapse: Secondary | ICD-10-CM | POA: Diagnosis not present

## 2022-10-24 DIAGNOSIS — E559 Vitamin D deficiency, unspecified: Secondary | ICD-10-CM | POA: Diagnosis not present

## 2022-10-24 MED ORDER — DULOXETINE HCL 30 MG PO CPEP: ORAL_CAPSULE | ORAL | 1 refills | Status: DC
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
## 2022-10-25 ENCOUNTER — Other Ambulatory Visit: Payer: Self-pay | Admitting: Neurology

## 2022-10-25 ENCOUNTER — Encounter (HOSPITAL_COMMUNITY): Payer: Self-pay | Admitting: Psychiatry

## 2022-10-25 MED ORDER — ACETAZOLAMIDE ER 500 MG PO CP12: 500.0000 mg | ORAL_CAPSULE | Freq: Two times a day (BID) | ORAL | 5 refills | Status: DC

## 2022-10-26 LAB — FSH/LH
FSH: 1.5 m[IU]/mL
LH: 2.6 m[IU]/mL

## 2022-10-26 LAB — IRON,TIBC AND FERRITIN PANEL

## 2022-10-26 LAB — ACTH: C206 ACTH: 16 pg/mL (ref 6–50)

## 2022-10-28 ENCOUNTER — Ambulatory Visit: Payer: Federal, State, Local not specified - PPO | Admitting: Family

## 2022-10-28 ENCOUNTER — Ambulatory Visit: Payer: Federal, State, Local not specified - PPO | Admitting: Internal Medicine

## 2022-10-28 DIAGNOSIS — G4730 Sleep apnea, unspecified: Secondary | ICD-10-CM | POA: Diagnosis not present

## 2022-10-28 DIAGNOSIS — I4711 Inappropriate sinus tachycardia, so stated: Secondary | ICD-10-CM | POA: Diagnosis not present

## 2022-10-28 DIAGNOSIS — R079 Chest pain, unspecified: Secondary | ICD-10-CM | POA: Diagnosis not present

## 2022-10-31 ENCOUNTER — Encounter: Payer: Self-pay | Admitting: Internal Medicine

## 2022-10-31 ENCOUNTER — Ambulatory Visit: Payer: Federal, State, Local not specified - PPO | Admitting: Internal Medicine

## 2022-10-31 VITALS — BP 113/78 | HR 71 | Temp 97.6°F | Ht 63.0 in | Wt 313.6 lb

## 2022-10-31 DIAGNOSIS — G932 Benign intracranial hypertension: Secondary | ICD-10-CM | POA: Diagnosis not present

## 2022-10-31 DIAGNOSIS — E559 Vitamin D deficiency, unspecified: Secondary | ICD-10-CM

## 2022-10-31 DIAGNOSIS — R7989 Other specified abnormal findings of blood chemistry: Secondary | ICD-10-CM | POA: Diagnosis not present

## 2022-10-31 DIAGNOSIS — R808 Other proteinuria: Secondary | ICD-10-CM

## 2022-10-31 HISTORY — DX: Other proteinuria: R80.8

## 2022-10-31 MED ORDER — VITAMIN D (ERGOCALCIFEROL) 1.25 MG (50000 UNIT) PO CAPS: 50000.0000 [IU] | ORAL_CAPSULE | ORAL | 1 refills | Status: DC

## 2022-10-31 NOTE — Progress Notes (Signed)
Amy Kent: [phone]   Routine Medical Office Visit  Patient:  Amy Kent      Age: 42 y.o.       Sex:  female  Date:   10/31/2022  PCP:    Dulce Sellar, NP   Today's Healthcare Provider: Lula Olszewski, MD   Problem Focused Charting:   Medical Decision Making per Assessment/Plan   Amy Kent was seen today for folow-up to discuss labs.  Vitamin D deficiency -     VITAMIN D 25 Hydroxy (Vit-D Deficiency, Fractures); Future  Low vitamin D level -     Vitamin D (Ergocalciferol); Take 1 capsule (50,000 Units total) by mouth every 7 (seven) days.  Dispense: 12 capsule; Refill: 1  Other proteinuria -     Microalbumin / creatinine urine ratio  IIH (idiopathic intracranial hypertension)  We reviewed her recent labs which were unrevealing except low vitamin D. Her symptom(s) have improved, saw cardiology, and they felt Holter (cardiac rhythm) monitor monitor not needed Diagnosis of IIH and vitamin D deficiency seems to best explain all of her recent symptomatology She is taking acetazolamide XR I encouraged continuing with that and neurology follow up, and not bothering with the Holter (cardiac rhythm) monitor I ordered unless heart palps return I think her CPAP nonadherence might be causing idiopathic intracranial hypertension and underlying her symptom(s). She felt reassured as she is feeling much better on most symptom(s) since lumbar puncture and diamox.  Therefore I encouraged her to return to follow up with Primary Care Provider (PCP) and I'd be happy so see her again as needed.   Will repeat the protein check and extended vitamin D at her request while asking for her to repeat the levels in 3 months   Subjective - Clinical Presentation:   Amy Kent is a 42 y.o. female  Patient Active Problem List   Diagnosis Date Noted   IIH (idiopathic intracranial hypertension) 10/31/2022   Other proteinuria 10/31/2022   Peripheral neuralgia 07/13/2022    ADHD, adult residual type 06/20/2022   Gastroesophageal reflux disease without esophagitis 06/20/2022   Abnormal weight gain 06/20/2022   GAD (generalized anxiety disorder) 05/27/2022   DDD (degenerative disc disease), lumbosacral 05/27/2022   Iron deficiency anemia due to chronic blood loss 03/24/2022   Empty sella turcica (HCC) 03/15/2022   Vitamin D deficiency 01/25/2021   Severe anxiety with panic 11/22/2020   Mild persistent asthma without complication 11/16/2018   Morbid obesity (HCC) 01/13/2017   Low grade squamous intraepithelial lesion on cytologic smear of cervix (LGSIL) 08/28/2014   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   Family history of coronary artery disease 04/05/2011   Migraine headache 10/08/2010   Major depression 09/10/2010   Past Medical History:  Diagnosis Date   Anemia    Anxiety    Asthma    Attention deficit disorder with hyperactivity 08/08/2014   Atypical chest pain 04/05/2011   Bruising 03/24/2022   Chronic anxiety 12/08/2020   DDD (degenerative disc disease), lumbosacral    Depression    DOE (dyspnea on exertion) 03/21/2022   Eustachian tube disorder 12/13/2010   10/1 IMO update   GERD (gastroesophageal reflux disease)    Insomnia 01/05/2021   Lumbar back pain 10/17/2014   Formatting of this note might be different from the original. recurrent   Moderate asthma with acute exacerbation 07/13/2022   Other acne 10/20/2008   Formatting of this note might be different from the original. Acne   Palpitations 03/21/2022  Sleep apnea    Mild    Outpatient Medications Prior to Visit  Medication Sig   acetaZOLAMIDE ER (DIAMOX) 500 MG capsule Take 1 capsule (500 mg total) by mouth 2 (two) times daily.   albuterol (VENTOLIN HFA) 108 (90 Base) MCG/ACT inhaler Inhale 2 puffs into the lungs every 6 (six) hours as needed for wheezing or shortness of breath.   amphetamine-dextroamphetamine (ADDERALL) 20 MG tablet Take 1 tablet (20 mg total) by  mouth daily before breakfast.   budesonide (PULMICORT) 0.5 MG/2ML nebulizer solution TAKE 2 ML (0.5 MG TOTAL) BY NEBULIZATION TWICE A DAY   budesonide-formoterol (SYMBICORT) 80-4.5 MCG/ACT inhaler Inhale 2 puffs into the lungs 2 (two) times daily.   clonazePAM (KLONOPIN) 0.5 MG tablet Take 0.5 tablets (0.25 mg total) by mouth daily as needed for anxiety.   diclofenac (VOLTAREN) 75 MG EC tablet Take 75 mg by mouth as needed for mild pain.   DULoxetine (CYMBALTA) 30 MG capsule Take 1 capsule (30 mg total) by mouth daily for 14 days, THEN 2 capsules (60 mg total) daily for 20 days.   ibuprofen (ADVIL) 800 MG tablet Take 800 mg by mouth every 6 (six) hours as needed.   Liraglutide -Weight Management (SAXENDA) 18 MG/3ML SOPN Inject 0.6 mg into the skin daily.   meclizine (ANTIVERT) 25 MG tablet Take by mouth.   montelukast (SINGULAIR) 10 MG tablet Take 10 mg by mouth at bedtime.   omeprazole (PRILOSEC) 20 MG capsule Take 1 capsule (20 mg total) by mouth daily.   ondansetron (ZOFRAN) 4 MG tablet Take 1 tablet (4 mg total) by mouth every 8 (eight) hours as needed for nausea or vomiting.   [DISCONTINUED] Vitamin D, Ergocalciferol, (DRISDOL) 1.25 MG (50000 UNIT) CAPS capsule Take 1 capsule (50,000 Units total) by mouth every 7 (seven) days.   No facility-administered medications prior to visit.    Chief Complaint  Patient presents with   Folow-up to discuss labs    HPI  Primary Care Provider (PCP) Dr. Andree CossHudnell but I saw and discussed with her 10/20/2022 about  palpitations, chest discomfort and dizziness in context of numerous other somatic complaints and long term sense of anxiety about feb 2022 changes in health that have led to abnormal weight gain.  I did acth cortisol fsh/lh but failed to find evidence of pcos or cushings- pelvic ultrasound is still pending to further evaluate. She saw cardiologist about the palpitations and echocardiogram / EKG were not worrisome and they have mostly resolved.  TSH  CBC,iron all normal- she has Holter (cardiac rhythm) monitor but hasn't done it, cardiologist advised her she doesn't need.  AI analysis last visit    Comprehensive Review of the Case: The patient is a 42 year old with a complex medical history including anxiety, sciatica, paresthesias, degenerative disk disease, iron deficiency requiring transfusions, empty sella syndrome, abnormal weight gain, severe anxiety with panic, mild asthma, and heavy menstrual periods. The patient's only medication is intermittent use of low-dose Klonopin. They have been experiencing intermittent chest pain, palpitations, dizziness, and a sensation of heartbeats in the pectoralis major tendon. The palpitations disrupt sleep, and the patient sometimes wakes up with dizziness. The patient has a history of syncope, now presenting as presyncope/dizziness. They have twitches in the legs and arms, and a prior Holter monitor was negative. A recent spinal tap for head pressure showed an opening pressure of 21, which relieved symptoms upon CSF removal. The patient has a high BMI of 57 and does not always use their CPAP  machine, sometimes waking with severe palpitations. An echocardiogram 18 months prior was negative. Laboratory data shows normal B12 and a negative urine drug screen. The patient has no known allergies. The physical examination revealed normal work of breathing and heart sounds. There is no detailed social history, medication list beyond Klonopin, or course of illness provided.   Most Likely Dx:   Idiopathic Intracranial Hypertension (IIH): The patient's symptoms of intermittent chest pain, palpitations, dizziness, and head pressure that was relieved by CSF removal, along with an opening pressure of 21 on spinal tap, suggest IIH. This condition is more common in individuals with obesity and can present with symptoms of increased intracranial pressure, such as headache and visual disturbances, which could be interpreted as  dizziness or presyncope. The empty sella is also a common finding in IIH. The presence of papilledema on fundoscopic examination could further suggest this diagnosis.   Expanded DDx:   Cardiomyopathy: Given the patient's high BMI and symptoms of chest pain, palpitations, and presyncope, a form of cardiomyopathy, such as obesity cardiomyopathy, could be considered. This condition can lead to heart failure and arrhythmias, which could explain the patient's cardiac symptoms. The presence of abnormal findings on echocardiogram or cardiac MRI could further suggest this diagnosis.   Sleep Apnea: The patient's obesity, non-compliance with CPAP, and symptoms of waking up with severe palpitations suggest obstructive sleep apnea (OSA). OSA can cause nocturnal arrhythmias and daytime symptoms of fatigue and dizziness due to disrupted sleep and intermittent hypoxia. A sleep study showing apneic episodes could further suggest this diagnosis.   Alternative DDx:   Polycystic Ovary Syndrome (PCOS): The patient's heavy menstrual periods, abnormal weight gain, and absence of hirsutism could suggest PCOS, which can also be associated with metabolic syndrome and an increased risk of cardiovascular issues. The presence of polycystic ovaries on ultrasound and elevated androgen levels could further suggest this diagnosis.   Iron Deficiency Anemia: The patient's history of iron deficiency requiring transfusions could explain symptoms of dizziness and palpitations due to anemia. The presence of low hemoglobin, hematocrit, and ferritin levels could further suggest this diagnosis.   Fibromyalgia: The widespread paresthesias, twitches, and history of anxiety and panic could be consistent with fibromyalgia, a condition characterized by widespread musculoskeletal pain and fatigue. The presence of tender points on physical examination could further suggest this diagnosis.   Multiple Sclerosis (MS): The patient's symptoms of  paresthesias, twitches, and a history of burning and numbing tingling in the left hand could suggest MS. The presence of demyelinating lesions on MRI could further suggest this diagnosis.   Myasthenia Gravis (MG): The patient's symptoms of muscle twitches and intermittent symptoms could suggest MG, an autoimmune neuromuscular disorder. The presence of acetylcholine receptor antibodies and a positive response to edrophonium test could further suggest this diagnosis.   Thyroid Dysfunction: Given the patient's abnormal weight gain and menstrual irregularities, thyroid dysfunction such as hypothyroidism could be considered. The presence of abnormal thyroid function tests could further suggest this diagnosis.   Adrenal Insufficiency: The patient's symptoms of fatigue, dizziness, and anxiety, along with a history of iron deficiency anemia, could suggest adrenal insufficiency. The presence of low serum cortisol and abnormal adrenocorticotropic hormone (ACTH) stimulation test could further suggest this diagnosis.   Cushing's Syndrome: The patient's obesity, abnormal weight gain, and menstrual irregularities could suggest Cushing's syndrome. The presence of elevated cortisol levels and abnormal dexamethasone suppression test could further suggest this diagnosis.     #Idiopathic Intracranial Hypertension   The patient presents with a  complex medical history, including symptoms that are suggestive of idiopathic intracranial hypertension (IIH), such as intermittent chest pain, palpitations, dizziness, and a history of head pressure that was relieved by cerebrospinal fluid (CSF) removal. The opening pressure of 21 on spinal tap is indicative of elevated intracranial pressure, which is consistent with IIH. The patient's high body mass index (BMI) and the presence of an empty sella syndrome are also supportive of this diagnosis. The negative Holter monitor and echocardiogram from 18 months prior make cardiac causes  less likely, although not entirely ruled out. The patient's non-compliance with CPAP use and symptoms upon waking could suggest sleep apnea contributing to their symptoms. The differential diagnosis includes idiopathic intracranial hypertension, cardiomyopathy, sleep apnea, polycystic ovary syndrome, iron deficiency anemia, fibromyalgia, multiple sclerosis, myasthenia gravis, thyroid dysfunction, adrenal insufficiency, and Cushing's syndrome.   Dx: - Repeat spinal tap with measurement of opening pressure to assess for persistent elevated intracranial pressure. - Fundoscopic examination to check for papilledema. - MRI of the brain with and without contrast to evaluate for structural causes and signs consistent with IIH. - Polysomnography to evaluate for obstructive sleep apnea. - Echocardiogram to reassess cardiac structure and function, given the patient's high BMI and symptoms. - Complete blood count and iron studies to evaluate for iron deficiency anemia. - Hormonal panel including thyroid function tests, serum cortisol, and ACTH stimulation test to assess for thyroid dysfunction and adrenal insufficiency. - Ultrasound of the ovaries and hormonal assays (LH, FSH, testosterone) to evaluate for polycystic ovary syndrome. - Neurological evaluation including MRI of the cervical spine and brain to assess for multiple sclerosis. - Acetylcholine receptor antibody test and edrophonium test for myasthenia gravis. - Dexamethasone suppression test to evaluate for Cushing's syndrome.   Tx: - Weight management and lifestyle modification to reduce BMI. - Regular use of CPAP machine for sleep apnea management, if diagnosed. - Acetazolamide or topiramate for reduction of CSF production if IIH is confirmed. - Therapeutic lumbar puncture for symptom relief if symptoms of elevated intracranial pressure persist. - Iron supplementation if iron deficiency anemia is confirmed. - Appropriate hormonal therapy if PCOS,  thyroid dysfunction, or adrenal insufficiency is diagnosed. - Symptomatic treatment for anxiety and panic, potentially reviewing the use of Klonopin and considering alternative or additional anxiolytics or antidepressants. - Referral to a neurologist for further evaluation and management of neurological symptoms. - Cardiology referral for management of any identified cardiomyopathy or arrhythmias.         Objective:  Physical Exam  BP 113/78 (BP Location: Left Arm, Patient Position: Sitting)   Pulse 71   Temp 97.6 F (36.4 C) (Temporal)   Ht 5\' 3"  (1.6 m)   Wt (!) 313 lb 9.6 oz (142.2 kg)   LMP 09/20/2022   SpO2 98%   BMI 55.55 kg/m  Severely obese  by BMI criteria but truncal adiposity (waist circumference or caliper) should be used instead. Wt Readings from Last 10 Encounters:  10/31/22 (!) 313 lb 9.6 oz (142.2 kg)  10/20/22 (!) 323 lb 3.2 oz (146.6 kg)  10/10/22 (!) 315 lb (142.9 kg)  10/05/22 (!) 317 lb (143.8 kg)  09/19/22 (!) 319 lb (144.7 kg)  08/31/22 (!) 315 lb (142.9 kg)  07/26/22 (!) 314 lb 12.8 oz (142.8 kg)  07/13/22 (!) 310 lb (140.6 kg)  07/13/22 (!) 312 lb 2 oz (141.6 kg)  06/20/22 (!) 318 lb (144.2 kg)   Vital signs reviewed.  Nursing notes reviewed. Weight trend reviewed. General Appearance:  Well developed, well nourished  female in no acute distress.   Normal work of breathing at rest Musculoskeletal: All extremities are intact.  Neurological:  Awake, alert,  No obvious focal neurological deficits or cognitive impairments Psychiatric:  Appropriate mood, pleasant demeanor Problem-specific findings:  neck circumference is probably 16" or more.   Results Reviewed: No results found for any visits on 10/31/22.  Recent Results (from the past 2160 hour(s))  Comprehensive metabolic panel     Status: Abnormal   Collection Time: 08/31/22  3:51 PM  Result Value Ref Range   Sodium 139 135 - 145 mmol/L   Potassium 3.9 3.5 - 5.1 mmol/L   Chloride 105 98 - 111  mmol/L   CO2 27 22 - 32 mmol/L   Glucose, Bld 121 (H) 70 - 99 mg/dL    Comment: Glucose reference range applies only to samples taken after fasting for at least 8 hours.   BUN 14 6 - 20 mg/dL   Creatinine, Ser 0.96 0.44 - 1.00 mg/dL   Calcium 8.8 (L) 8.9 - 10.3 mg/dL   Total Protein 7.8 6.5 - 8.1 g/dL   Albumin 3.7 3.5 - 5.0 g/dL   AST 21 15 - 41 U/L   ALT 14 0 - 44 U/L   Alkaline Phosphatase 77 38 - 126 U/L   Total Bilirubin 0.3 0.3 - 1.2 mg/dL   GFR, Estimated >60 >60 mL/min    Comment: (NOTE) Calculated using the CKD-EPI Creatinine Equation (2021)    Anion gap 7 5 - 15    Comment: Performed at [facility], [address] [place], [place] [address]  CBC with Differential     Status: None   Collection Time: 08/31/22  3:51 PM  Result Value Ref Range   WBC 10.1 4.0 - 10.5 K/uL   RBC 4.66 3.87 - 5.11 MIL/uL   Hemoglobin 12.4 12.0 - 15.0 g/dL   HCT 39.3 36.0 - 46.0 %   MCV 84.3 80.0 - 100.0 fL   MCH 26.6 26.0 - 34.0 pg   MCHC 31.6 30.0 - 36.0 g/dL   RDW 14.6 11.5 - 15.5 %   Platelets 352 150 - 400 K/uL   nRBC 0.0 0.0 - 0.2 %   Neutrophils Relative % 66 %   Neutro Abs 6.7 1.7 - 7.7 K/uL   Lymphocytes Relative 21 %   Lymphs Abs 2.1 0.7 - 4.0 K/uL   Monocytes Relative 9 %   Monocytes Absolute 0.9 0.1 - 1.0 K/uL   Eosinophils Relative 3 %   Eosinophils Absolute 0.3 0.0 - 0.5 K/uL   Basophils Relative 1 %   Basophils Absolute 0.1 0.0 - 0.1 K/uL   Immature Granulocytes 0 %   Abs Immature Granulocytes 0.02 0.00 - 0.07 K/uL    Comment: Performed at [facility], [address] [place], [place] [address]  Cytology - non pap     Status: None   Collection Time: 09/29/22 11:58 AM  Result Value Ref Range   CYTOLOGY - NON GYN      CYTOLOGY - NON PAP CASE: [id number] PATIENT: Amy Kent Non-Gynecological Cytology Report     Clinical History: None provided Specimen Submitted:  A. CEREBRAL SPINAL FLUID, SPINAL TAP:   FINAL MICROSCOPIC  DIAGNOSIS: - No malignant cells identified  SPECIMEN ADEQUACY: Satisfactory for evaluation  GROSS: Received is/are 2cc's of clear colorless fluid.(Tb:tb) Smears: 0 Concentration Method (Thin Prep):1 Cell Block: 0 Additional Studies: NA     Final Diagnosis performed by Mark Martinique,  MD.   Electronically signed 10/04/2022 Technical and / or Professional components performed at [employer]. [facility], [address], [place], [place] [address].  Immunohistochemistry Technical component (if applicable) was performed at [facility]. [address], [place], [place] [address].   IMMUNOHISTOCHEMISTRY DISCLAIMER (if applicable): Some of these immunohistochemical stains may have been developed and the performance char acteristics determine by [facility]. Some may not have been cleared or approved by the U.S. Food and Drug Administration. The FDA has determined that such clearance or approval is not necessary. This test is used for clinical purposes. It should not be regarded as investigational or for research. This laboratory is certified under the Clinical Laboratory Improvement Amendments of 1988 (CLIA-88) as qualified to perform high complexity clinical laboratory testing.  The controls stained appropriately.   Glucose, CSF     Status: None   Collection Time: 09/29/22 12:03 PM  Result Value Ref Range   Glucose, CSF 59 40 - 80 mg/dL  CSF cell count with differential     Status: Abnormal   Collection Time: 09/29/22 12:03 PM  Result Value Ref Range   Color, CSF GRAY-WHITE (A) COLORLESS   Appearance, CSF CLEAR CLEAR   RBC Count, CSF 700 (H) 0 cells/uL   TOTAL NUCLEATED CELL 0 0 - 5 cells/uL   Monocyte/Macrophage CANCELED     Comment: Result canceled by the ancillary.  CSF culture     Status: None   Collection Time: 09/29/22 12:03 PM   Specimen: Lumbar Puncture; Cerebrospinal Fluid  Result Value Ref Range   MICRO NUMBER: [id number]     SPECIMEN QUALITY: Adequate    Source CEREBROSPINAL FLUID (CSF)    STATUS: FINAL    GRAM STAIN:      Gram stain prepared by cytospin No organisms or white blood cells seen   Result: No Growth   Protein, CSF     Status: None   Collection Time: 09/29/22 12:03 PM  Result Value Ref Range   Total Protein, CSF 23 15 - 45 mg/dL  CBG monitoring, ED     Status: None   Collection Time: 10/10/22  5:27 PM  Result Value Ref Range   Glucose-Capillary 99 70 - 99 mg/dL    Comment: Glucose reference range applies only to samples taken after fasting for at least 8 hours.   Comment 1 Notify RN   Vitamin D (25 hydroxy)     Status: Abnormal   Collection Time: 10/20/22  2:37 PM  Result Value Ref Range   VITD 14.39 (L) 30.00 - 100.00 ng/mL  TSH     Status: None   Collection Time: 10/20/22  2:37 PM  Result Value Ref Range   TSH 1.45 0.35 - 5.50 uIU/mL  CBC     Status: None   Collection Time: 10/20/22  2:37 PM  Result Value Ref Range   WBC 10.3 4.0 - 10.5 K/uL   RBC 4.60 3.87 - 5.11 Mil/uL   Platelets 348.0 150.0 - 400.0 K/uL   Hemoglobin 12.3 12.0 - 15.0 g/dL   HCT 78.2 95.6 - 21.3 %   MCV 82.5 78.0 - 100.0 fl   MCHC 32.4 30.0 - 36.0 g/dL   RDW 08.6 57.8 - 46.9 %  Comp Met (CMET)     Status: None   Collection Time: 10/20/22  2:37 PM  Result Value Ref Range   Sodium 138 135 - 145 mEq/L   Potassium 4.2 3.5 - 5.1 mEq/L  Chloride 104 96 - 112 mEq/L   CO2 28 19 - 32 mEq/L   Glucose, Bld 83 70 - 99 mg/dL   BUN 15 6 - 23 mg/dL   Creatinine, Ser 4.090.86 0.40 - 1.20 mg/dL   Total Bilirubin 0.3 0.2 - 1.2 mg/dL   Alkaline Phosphatase 70 39 - 117 U/L   AST 12 0 - 37 U/L   ALT 10 0 - 35 U/L   Total Protein 7.2 6.0 - 8.3 g/dL   Albumin 4.1 3.5 - 5.2 g/dL   GFR 81.1985.19 >14.78>60.00 mL/min    Comment: Calculated using the CKD-EPI Creatinine Equation (2021)   Calcium 8.9 8.4 - 10.5 mg/dL  ACTH     Status: None   Collection Time: 10/20/22  2:37 PM  Result Value Ref Range   C206 ACTH 16 6 - 50 pg/mL     Comment: . Reference range applies only to specimens collected between 7am-10am. .   Cortisol     Status: None   Collection Time: 10/20/22  2:37 PM  Result Value Ref Range   Cortisol, Plasma 9.4 ug/dL    Comment: AM:  4.3 - 22.4 ug/dLPM:  3.1 - 16.7 ug/dL  Iron, TIBC and Ferritin Panel     Status: None   Collection Time: 10/20/22  2:37 PM  Result Value Ref Range   Iron TNP/109     Comment: Test cancelled per client request. . => Indicates changed result(s) or information. Marland Kitchen. PLEASE DISREGARD PREVIOUSLY REPORTED INFORMATION BELOW:  (The information below was originally reported on 10/22/2022 at 4:07 AM) IRON, TOTAL     50       Ferritin CANCELED     Comment: Test cancelled per client request.  Result canceled by the ancillary.   FSH/LH     Status: None   Collection Time: 10/20/22  2:37 PM  Result Value Ref Range   FSH 1.5 mIU/mL    Comment:                     Reference Range .              Follicular Phase       2.5-10.2              Mid-cycle Peak         3.1-17.7              Luteal Phase           1.5- 9.1              Postmenopausal       23.0-116.3              .    LH 2.6 mIU/mL    Comment:     Reference Range Follicular Phase  1.9-12.5 Mid-Cycle Peak    8.7-76.3 Luteal Phase      0.5-16.9 Postmenopausal    10.0-54.7   Testosterone     Status: None   Collection Time: 10/20/22  2:37 PM  Result Value Ref Range   Testosterone 20.89 15.00 - 40.00 ng/dL          Signed: Lula Olszewskiyan G Lam Mccubbins, MD 10/31/2022 8:19 PM

## 2022-11-01 DIAGNOSIS — N870 Mild cervical dysplasia: Secondary | ICD-10-CM | POA: Diagnosis not present

## 2022-11-01 DIAGNOSIS — Z01812 Encounter for preprocedural laboratory examination: Secondary | ICD-10-CM | POA: Diagnosis not present

## 2022-11-01 DIAGNOSIS — R87810 Cervical high risk human papillomavirus (HPV) DNA test positive: Secondary | ICD-10-CM | POA: Diagnosis not present

## 2022-11-01 DIAGNOSIS — R87612 Low grade squamous intraepithelial lesion on cytologic smear of cervix (LGSIL): Secondary | ICD-10-CM | POA: Diagnosis not present

## 2022-11-01 LAB — MICROALBUMIN / CREATININE URINE RATIO
Creatinine,U: 149 mg/dL
Microalb Creat Ratio: 1.7 mg/g (ref 0.0–30.0)
Microalb, Ur: 2.6 mg/dL — ABNORMAL HIGH (ref 0.0–1.9)

## 2022-11-01 NOTE — Progress Notes (Signed)
This is trending back to normal, is almost completely normal now.  Suggest rehydrate and recheck in 1-12 months

## 2022-11-07 DIAGNOSIS — F411 Generalized anxiety disorder: Secondary | ICD-10-CM | POA: Diagnosis not present

## 2022-11-07 DIAGNOSIS — R42 Dizziness and giddiness: Secondary | ICD-10-CM | POA: Diagnosis not present

## 2022-11-07 DIAGNOSIS — R519 Headache, unspecified: Secondary | ICD-10-CM | POA: Diagnosis not present

## 2022-11-07 DIAGNOSIS — R202 Paresthesia of skin: Secondary | ICD-10-CM | POA: Diagnosis not present

## 2022-11-21 DIAGNOSIS — Z0389 Encounter for observation for other suspected diseases and conditions ruled out: Secondary | ICD-10-CM | POA: Diagnosis not present

## 2022-11-21 DIAGNOSIS — G932 Benign intracranial hypertension: Secondary | ICD-10-CM | POA: Diagnosis not present

## 2022-11-24 ENCOUNTER — Telehealth (HOSPITAL_COMMUNITY): Payer: Federal, State, Local not specified - PPO | Admitting: Psychiatry

## 2022-12-01 ENCOUNTER — Encounter: Payer: Self-pay | Admitting: Family

## 2022-12-01 ENCOUNTER — Encounter (HOSPITAL_COMMUNITY): Payer: Federal, State, Local not specified - PPO | Admitting: Psychiatry

## 2022-12-01 ENCOUNTER — Encounter (HOSPITAL_COMMUNITY): Payer: Self-pay

## 2022-12-01 ENCOUNTER — Ambulatory Visit: Payer: Federal, State, Local not specified - PPO | Admitting: Family

## 2022-12-01 VITALS — BP 131/82 | Temp 98.6°F | Ht 63.0 in

## 2022-12-01 DIAGNOSIS — J4540 Moderate persistent asthma, uncomplicated: Secondary | ICD-10-CM

## 2022-12-01 DIAGNOSIS — E559 Vitamin D deficiency, unspecified: Secondary | ICD-10-CM | POA: Diagnosis not present

## 2022-12-01 DIAGNOSIS — J45909 Unspecified asthma, uncomplicated: Secondary | ICD-10-CM | POA: Diagnosis not present

## 2022-12-01 DIAGNOSIS — R42 Dizziness and giddiness: Secondary | ICD-10-CM | POA: Diagnosis not present

## 2022-12-01 DIAGNOSIS — F410 Panic disorder [episodic paroxysmal anxiety] without agoraphobia: Secondary | ICD-10-CM | POA: Diagnosis not present

## 2022-12-01 DIAGNOSIS — F419 Anxiety disorder, unspecified: Secondary | ICD-10-CM | POA: Diagnosis not present

## 2022-12-01 DIAGNOSIS — Z131 Encounter for screening for diabetes mellitus: Secondary | ICD-10-CM | POA: Diagnosis not present

## 2022-12-01 DIAGNOSIS — D649 Anemia, unspecified: Secondary | ICD-10-CM | POA: Diagnosis not present

## 2022-12-01 DIAGNOSIS — F411 Generalized anxiety disorder: Secondary | ICD-10-CM | POA: Diagnosis not present

## 2022-12-01 DIAGNOSIS — Z13220 Encounter for screening for lipoid disorders: Secondary | ICD-10-CM | POA: Diagnosis not present

## 2022-12-01 DIAGNOSIS — E669 Obesity, unspecified: Secondary | ICD-10-CM | POA: Diagnosis not present

## 2022-12-01 DIAGNOSIS — Z6841 Body Mass Index (BMI) 40.0 and over, adult: Secondary | ICD-10-CM | POA: Diagnosis not present

## 2022-12-01 DIAGNOSIS — R202 Paresthesia of skin: Secondary | ICD-10-CM | POA: Diagnosis not present

## 2022-12-01 DIAGNOSIS — G4733 Obstructive sleep apnea (adult) (pediatric): Secondary | ICD-10-CM | POA: Diagnosis not present

## 2022-12-01 DIAGNOSIS — D500 Iron deficiency anemia secondary to blood loss (chronic): Secondary | ICD-10-CM | POA: Diagnosis not present

## 2022-12-01 MED ORDER — ESCITALOPRAM OXALATE 10 MG PO TABS: 10.0000 mg | ORAL_TABLET | Freq: Every day | ORAL | 1 refills | Status: DC

## 2022-12-01 MED ORDER — CLONAZEPAM 1 MG PO TABS: 0.5000 mg | ORAL_TABLET | Freq: Every day | ORAL | 0 refills | Status: DC | PRN

## 2022-12-01 NOTE — Assessment & Plan Note (Signed)
Chronic, Unstable, Non-compliant  again with exp wheezing noted on exam, pt states she is not using Symbicort daily advised on using 2 puffs bid, if using the Pulmicort neb tx, do this bid and not the Symbicort, but should take the Symbicort with her if travelling or away from home for long periods. f/u 1 month

## 2022-12-01 NOTE — Assessment & Plan Note (Addendum)
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

## 2022-12-01 NOTE — Progress Notes (Deleted)
BH MD/PA/NP OP Progress Note  12/01/2022 10:44 AM Amy Kent  MRN:  [id number]  Visit Diagnosis: No diagnosis found.  Assessment: Amy Kent is a 42 y.o. female with a history of anxiety, iron deficiency anemia, vitamin d deficiency, sleep apnea reported idiopathic intracranial hypertension who presented to [place] at [facility] for initial evaluation on 10/24/2022.    At initial evaluation patient reported symptoms consistent with anxiety including feeling nervous or on edge, being unable to stop or control her worrying, worrying too much about different things, difficulty relaxing, restlessness, increased irritability, and fears that something awful might happen.  She also had endorsed some increased anhedonia, low mood, difficulty sleeping, and poor appetite over the last year and a half.  Patient denied any SI or thoughts of self-harm but did report having a firearm.  Safety planning and crisis resources were discussed along with firearm safety measures.  Patient has had a diagnosis of borderline personality disorder in the past and endorses symptoms of mood lability, splitting, poor sense of self, and unstable interpersonal relationships.  Of note she also has a number of somatic symptoms including dizziness, paresthesias, loss of sensation, blurry vision, and head pressure.  Patient has had extensive medical workup with some signs consistent with IIH however not enough to meet criteria. She has been following with neurology and her PCP in regards to this.  At this time patient meets criteria for generalized anxiety disorder with further evaluation needed to rule out somatic symptom disorder/conversion disorder.  She also has several traits of borderline personality disorder which will continue to be assessed.  Patient would benefit from starting long-term anxiolytic medications in addition to regular CPAP use.  Jackie Sturm presents for follow-up evaluation. Today,  12/01/22, patient reports ***  Plan: - Start Cymbalta 30 mg QD and increase to 30 mg BID in 2 weeks - Continue Klonopin 0.25 mg QD prn for anxiety uses intermittently - Continue Adderall 20 mg QD only takes intermittently managed by PCP - CMP, CBC, iron panel, Vit D, TSH, Vit B12, LH, FSH reviewed - Continue Vit D replacement therapy - Continue with therapist - Recommend CPAP use - Crisis resources reviewed - Follow up in a month   Chief Complaint: No chief complaint on file.  HPI: ***   Past Psychiatric History: Saw a psychiatrist in the past and was diagnosed with borderline personality disorder. ADHD was later diagnosed by her PCP.   Has tried Lorazepam, Lexapro was helpful for depression but got sexual side effects (anorgasmia), Effexor (too much mood suppression), only tried Trintellix for a week and has not tried Theatre stage manager.  Uses marijuana 3-4 times in the past year. Alcohol once a month or so.   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 recurrent   Moderate asthma with acute exacerbation 07/13/2022   Other acne 10/20/2008   Formatting of this note might be different from the original. Acne   Palpitations  03/21/2022   Sleep apnea    Mild   No past surgical history on file.  Family Psychiatric History: ***  Family History:  Family History  Problem Relation Age of Onset   Diabetes Mother    Hypertension Mother    COPD Father     Social History:  Social  History   Socioeconomic History   Marital status: Single    Spouse name: Not on file   Number of children: Not on file   Years of education: Not on file   Highest education level: Not on file  Occupational History   Not on file  Tobacco Use   Smoking status: Former   Smokeless tobacco: Never  Substance and Sexual Activity   Alcohol use: Yes    Comment: socially   Drug use: Yes    Types: Marijuana    Comment: occ   Sexual activity: Not on file  Other Topics Concern   Not on file  Social History Narrative   Not on file   Social Determinants of Health   Financial Resource Strain: Not on file  Food Insecurity: Not on file  Transportation Needs: Not on file  Physical Activity: Not on file  Stress: Not on file  Social Connections: Not on file    Allergies: No Known Allergies  Current Medications: Current Outpatient Medications  Medication Sig Dispense Refill   acetaZOLAMIDE ER (DIAMOX) 500 MG capsule Take 1 capsule (500 mg total) by mouth 2 (two) times daily. 60 capsule 5   albuterol (VENTOLIN HFA) 108 (90 Base) MCG/ACT inhaler Inhale 2 puffs into the lungs every 6 (six) hours as needed for wheezing or shortness of breath. 8 g 5   amphetamine-dextroamphetamine (ADDERALL) 20 MG tablet Take 1 tablet (20 mg total) by mouth daily before breakfast. 30 tablet 0   budesonide (PULMICORT) 0.5 MG/2ML nebulizer solution TAKE 2 ML (0.5 MG TOTAL) BY NEBULIZATION TWICE A DAY 360 mL 1   budesonide-formoterol (SYMBICORT) 80-4.5 MCG/ACT inhaler Inhale 2 puffs into the lungs 2 (two) times daily. 1 each 2   clonazePAM (KLONOPIN) 0.5 MG tablet Take 0.5 tablets (0.25 mg total) by mouth daily as needed for anxiety. 15 tablet 2   diclofenac (VOLTAREN) 75 MG EC tablet Take 75 mg by mouth as needed for mild pain.     DULoxetine (CYMBALTA) 30 MG capsule Take 1 capsule (30 mg total) by mouth daily for 14 days, THEN 2 capsules (60 mg total) daily for 20 days. 52 capsule 1   ibuprofen (ADVIL) 800 MG  tablet Take 800 mg by mouth every 6 (six) hours as needed.     Liraglutide -Weight Management (SAXENDA) 18 MG/3ML SOPN Inject 0.6 mg into the skin daily. 3 mL 0   meclizine (ANTIVERT) 25 MG tablet Take by mouth.     montelukast (SINGULAIR) 10 MG tablet Take 10 mg by mouth at bedtime.     omeprazole (PRILOSEC) 20 MG capsule Take 1 capsule (20 mg total) by mouth daily. 90 capsule 1   ondansetron (ZOFRAN) 4 MG tablet Take 1 tablet (4 mg total) by mouth every 8 (eight) hours as needed for nausea or vomiting. 20 tablet 0   Vitamin D, Ergocalciferol, (DRISDOL) 1.25 MG (50000 UNIT) CAPS capsule Take 1 capsule (50,000 Units total) by mouth every 7 (seven) days. 12 capsule 1   No current facility-administered medications for this visit.     Psychiatric Specialty Exam: Review of Systems  There were no vitals taken for this visit.There is no  height or weight on file to calculate BMI.  General Appearance: {Appearance:22683}  Eye Contact:  {BHH EYE CONTACT:22684}  Speech:  {Speech:22685}  Volume:  {Volume (PAA):22686}  Mood:  {BHH MOOD:22306}  Affect:  {Affect (PAA):22687}  Thought Process:  {Thought Process (PAA):22688}  Orientation:  {BHH ORIENTATION (PAA):22689}  Thought Content: {Thought Content:22690}   Suicidal Thoughts:  {ST/HT (PAA):22692}  Homicidal Thoughts:  {ST/HT (PAA):22692}  Memory:  {BHH MEMORY:22881}  Judgement:  {Judgement (PAA):22694}  Insight:  {Insight (PAA):22695}  Psychomotor Activity:  {Psychomotor (PAA):22696}  Concentration:  {Concentration:21399}  Recall:  {BHH GOOD/FAIR/POOR:22877}  Fund of Knowledge: {BHH GOOD/FAIR/POOR:22877}  Language: {BHH GOOD/FAIR/POOR:22877}  Akathisia:  {BHH YES OR NO:22294}    AIMS (if indicated): {Desc; done/not:10129}  Assets:  {Assets (PAA):22698}  ADL's:  {BHH XO:4411959  Cognition: {chl bhh cognition:304700322}  Sleep:  {BHH 0000000   Metabolic Disorder Labs: No results found for: "HGBA1C", "MPG" No results  found for: "PROLACTIN" No results found for: "CHOL", "TRIG", "HDL", "CHOLHDL", "VLDL", "LDLCALC" Lab Results  Component Value Date   TSH 1.45 10/20/2022    Therapeutic Level Labs: No results found for: "LITHIUM" No results found for: "VALPROATE" No results found for: "CBMZ"   Screenings: GAD-7    Flowsheet Row Office Visit from 10/24/2022 in [facility] Office Visit from 05/27/2022 in [place]  Total GAD-7 Score 21 21      PHQ2-9    [place] Visit from 10/24/2022 in [facility] Office Visit from 05/27/2022 in [place]  PHQ-2 Total Score 6 2  PHQ-9 Total Score 11 [place] Office Visit from 10/24/2022 in [facility] ED from 10/10/2022 in [facility] Emergency Department at [facility] ED from 08/31/2022 in [facility] Emergency Department at [place] No Risk No Risk No Risk       Collaboration of Care: Collaboration of Care: Medication Management AEB ***, Primary Care Provider AEB chart review, and Other provider involved in patient's care AEB neurology chart review  Patient/Guardian was advised Release of Information must be obtained prior to any record release in order to collaborate their care with an outside provider. Patient/Guardian was advised if they have not already done so to contact the registration department to sign all necessary forms in order for [place] to release information regarding their care.   Consent: Patient/Guardian gives verbal consent for treatment and assignment of benefits for services provided during this visit. Patient/Guardian expressed understanding and agreed to proceed.    Vista Mink, MD 12/01/2022, 10:44 AM   Virtual Visit via Video Note  I connected with Lynnae Prude on 12/01/22 at  4:00 PM EST by a video  enabled telemedicine application and verified that I am speaking with the correct person using two identifiers.  Location: Patient: Home Provider: Home Office   I discussed the limitations of evaluation and management by telemedicine and the availability of in person appointments. The patient expressed understanding and agreed to proceed.   I discussed the assessment and treatment plan with the patient. The patient was provided an opportunity to ask questions and all were answered. The patient agreed with the plan and demonstrated an understanding of the instructions.   The patient was advised to call back or seek an in-person evaluation if the symptoms worsen or if the condition fails to improve as anticipated.  I provided *** minutes of non-face-to-face  time during this encounter.   Vista Mink, MD

## 2022-12-01 NOTE — Progress Notes (Signed)
Patient ID: Amy Kent, female    DOB: 08/14/1981, 42 y.o.   MRN: [id number]  Chief Complaint  Patient presents with   Dizziness    Pt states has been having dizzy spells. Pt is wheezing    HPI: Asthma:  pt recently w/exacerbation with coughing, a lot of phlegm, and wheezing, and restarted Symbicort and Albuterol rescue, and also has Singulair but she forgets to take. Pt reports not taking the Symbicort daily and does not have refills for the rescue (given in ER).  Anxiety/Depression:  pt reports many year history of anxiety and has failed many meds. She thinks she is very sensitive to medications as even small doses cause side effects: Ativan was most helpful but still caused SOB as well as Hydroxyzine as well as drowsiness even at 26m dose, Trazodone- drowsiness during day, Lexapro - anorgasmia. Started on Klonopin prn and Trintellix, pt took samples but states she did not notice any difference. Seen by Psych and started on Cymbalta which she does not like, GI issues, stopped taking. Has been taking Klonopin, wants increased dose. Dizziness:  reports meclizine not working, RX diamox for IIH started recently by Neuro is making sx worse. Also describes feeling like strong pressure on top of her head.   Assessment & Plan:   Problem List Items Addressed This Visit       Respiratory   Moderate persistent asthma - Primary    Chronic, Unstable, Non-compliant  again with exp wheezing noted on exam, pt states she is not using Symbicort daily advised on using 2 puffs bid, if using the Pulmicort neb tx, do this bid and not the Symbicort, but should take the Symbicort with her if travelling or away from home for long periods. f/u 1 month        Other   Generalized anxiety disorder    chronic, Unstable, Non-compliant never started Pristiq given Cymbalta by Psych, caused GI issues, so stopped Psych considering dx of somatic disorder & borderline personality disorder has taken the Klonopin  qd, wants increased dose reminded pt of addiction potential, will send 179mto cut in half and take bid only if needed, will not continue to refill this monthly, needs to last beyond 1 mo - only started to allow her maintenance med to kick in refilling Lexapro 1046msince she tolerated this, advised more important to get her anxiety under control right now vs worrying about orgasm can increase to 47m96mter 2 weeks if needed, advised she has to be on this daily and to let her Psych know - must keep psych appts f/u 1 month      Relevant Medications   clonazePAM (KLONOPIN) 1 MG tablet   escitalopram (LEXAPRO) 10 MG tablet   Other Visit Diagnoses     Dizziness of unknown etiology    - unsure if related to new Diamoxx, or vertigo episode related to her severe anxiety and other medical symptoms. Pt advised to try 1/2 pill of Meclizine during the day and 1 whole pill in the evenings, needs to take at least 2-3 times per day to get over episode. Also advised to Report sx to Psych & Neuro providers as could be r/t other meds.   Subjective:    Outpatient Medications Prior to Visit  Medication Sig Dispense Refill   albuterol (VENTOLIN HFA) 108 (90 Base) MCG/ACT inhaler Inhale 2 puffs into the lungs every 6 (six) hours as needed for wheezing or shortness of breath. 8 g 5  budesonide (PULMICORT) 0.5 MG/2ML nebulizer solution TAKE 2 ML (0.5 MG TOTAL) BY NEBULIZATION TWICE A DAY 360 mL 1   omeprazole (PRILOSEC) 20 MG capsule Take 1 capsule (20 mg total) by mouth daily. 90 capsule 1   Vitamin D, Ergocalciferol, (DRISDOL) 1.25 MG (50000 UNIT) CAPS capsule Take 1 capsule (50,000 Units total) by mouth every 7 (seven) days. 12 capsule 1   clonazePAM (KLONOPIN) 0.5 MG tablet Take 0.5 tablets (0.25 mg total) by mouth daily as needed for anxiety. 15 tablet 2   diclofenac (VOLTAREN) 75 MG EC tablet Take 75 mg by mouth as needed for mild pain.     ibuprofen (ADVIL) 800 MG tablet Take 800 mg by mouth every 6 (six)  hours as needed.     acetaZOLAMIDE ER (DIAMOX) 500 MG capsule Take 1 capsule (500 mg total) by mouth 2 (two) times daily. (Patient not taking: Reported on 12/01/2022) 60 capsule 5   budesonide-formoterol (SYMBICORT) 80-4.5 MCG/ACT inhaler Inhale 2 puffs into the lungs 2 (two) times daily. (Patient not taking: Reported on 12/01/2022) 1 each 2   Liraglutide -Weight Management (SAXENDA) 18 MG/3ML SOPN Inject 0.6 mg into the skin daily. (Patient not taking: Reported on 12/01/2022) 3 mL 0   meclizine (ANTIVERT) 25 MG tablet Take by mouth.     montelukast (SINGULAIR) 10 MG tablet Take 10 mg by mouth at bedtime. (Patient not taking: Reported on 12/01/2022)     ondansetron (ZOFRAN) 4 MG tablet Take 1 tablet (4 mg total) by mouth every 8 (eight) hours as needed for nausea or vomiting. (Patient not taking: Reported on 12/01/2022) 20 tablet 0   amphetamine-dextroamphetamine (ADDERALL) 20 MG tablet Take 1 tablet (20 mg total) by mouth daily before breakfast. 30 tablet 0   DULoxetine (CYMBALTA) 30 MG capsule Take 1 capsule (30 mg total) by mouth daily for 14 days, THEN 2 capsules (60 mg total) daily for 20 days. 52 capsule 1   No facility-administered medications prior to visit.   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 recurrent   Migraine headache 10/08/2010   Formatting of this note might be different from the original.  Migraine Headache     10/1 IMO update   Moderate asthma with acute exacerbation 07/13/2022   Other  acne 10/20/2008   Formatting of this note might be different from the original. Acne   Other proteinuria 10/31/2022   Palpitations 03/21/2022   Sleep apnea    Mild   History reviewed. No pertinent surgical history. No Known Allergies    Objective:    Physical Exam Vitals and nursing note reviewed.  Constitutional:      Appearance: Normal appearance. She is obese.  Cardiovascular:     Rate and Rhythm: Normal rate and regular rhythm.  Pulmonary:     Effort: Pulmonary effort is normal.     Breath sounds: Normal breath sounds.  Musculoskeletal:        General: Normal range of motion.  Skin:    General: Skin is warm and dry.  Neurological:     Mental Status: She is alert.  Psychiatric:        Mood and Affect: Mood normal.        Behavior: Behavior normal.    BP 131/82   Temp 98.6 F (37 C) (Temporal)   Ht 5' 3"$  (1.6 m)   BMI 55.55 kg/m  Wt Readings from Last 3 Encounters:  10/31/22 (!) 313 lb 9.6 oz (142.2 kg)  10/20/22 (!) 323 lb 3.2 oz (146.6 kg)  10/10/22 (!) 315 lb (142.9 kg)      *Extra time (55mn) spent with patient today which consisted of chart review of recent specialist care, discussing diagnoses, work up, treatment, answering questions, and documentation.   HJeanie Sewer NP

## 2022-12-01 NOTE — Progress Notes (Signed)
This encounter was created in error - please disregard.

## 2022-12-01 NOTE — Patient Instructions (Addendum)
Take the following medications daily!  Lexapro 54m -NEW - unless your Psychiatrist says not to! Symbicort inhaler - 2 puffs twice a day UNLESS you use your Pulmicort nebulizer twice a day, or once a day and  the Symbicort once a day. Prilosec 228m- (this is for stomach acid)  Take your Vitamin D capsule WEEKLY.  You have to follow up with Dr. ToEverette Ranknd let him know you are not tolerating the Diamox.  Keep your appointments with Dr. BuNelida Goresyour psychiatrist!  Schedule a 1 month follow up visit, can be virtual (via MyChart).

## 2022-12-02 ENCOUNTER — Encounter: Payer: Self-pay | Admitting: Family

## 2022-12-02 ENCOUNTER — Ambulatory Visit: Payer: Federal, State, Local not specified - PPO | Admitting: Family

## 2022-12-02 ENCOUNTER — Telehealth: Payer: Self-pay | Admitting: Family

## 2022-12-02 DIAGNOSIS — J0110 Acute frontal sinusitis, unspecified: Secondary | ICD-10-CM

## 2022-12-02 MED ORDER — AMOXICILLIN-POT CLAVULANATE 875-125 MG PO TABS: 1.0000 | ORAL_TABLET | Freq: Two times a day (BID) | ORAL | 0 refills | Status: DC

## 2022-12-02 NOTE — Telephone Encounter (Signed)
Pt send a message via MyChart at 830am on today- patient states she was seen by stephanie on 2/22. Patient was advised that if Sinus / mucus or infections were not addressed by provider, an office visit would be necessary. Patient declines to come back for an OV. Patient wants medications to be sent and wants office to call back.

## 2022-12-03 ENCOUNTER — Other Ambulatory Visit: Payer: Self-pay | Admitting: Family

## 2022-12-03 DIAGNOSIS — F411 Generalized anxiety disorder: Secondary | ICD-10-CM

## 2022-12-06 ENCOUNTER — Other Ambulatory Visit: Payer: Self-pay

## 2022-12-06 DIAGNOSIS — R6884 Jaw pain: Secondary | ICD-10-CM | POA: Insufficient documentation

## 2022-12-06 DIAGNOSIS — R079 Chest pain, unspecified: Secondary | ICD-10-CM | POA: Diagnosis not present

## 2022-12-06 DIAGNOSIS — R072 Precordial pain: Secondary | ICD-10-CM | POA: Diagnosis not present

## 2022-12-06 DIAGNOSIS — J45909 Unspecified asthma, uncomplicated: Secondary | ICD-10-CM | POA: Diagnosis not present

## 2022-12-06 DIAGNOSIS — M25512 Pain in left shoulder: Secondary | ICD-10-CM | POA: Diagnosis not present

## 2022-12-07 ENCOUNTER — Emergency Department (HOSPITAL_BASED_OUTPATIENT_CLINIC_OR_DEPARTMENT_OTHER): Payer: Federal, State, Local not specified - PPO

## 2022-12-07 ENCOUNTER — Other Ambulatory Visit: Payer: Self-pay

## 2022-12-07 ENCOUNTER — Emergency Department (HOSPITAL_BASED_OUTPATIENT_CLINIC_OR_DEPARTMENT_OTHER)
Admission: EM | Admit: 2022-12-07 | Discharge: 2022-12-07 | Disposition: A | Discharge status: Home / Self Care | Payer: Federal, State, Local not specified - PPO | Attending: Emergency Medicine | Admitting: Emergency Medicine

## 2022-12-07 ENCOUNTER — Encounter (HOSPITAL_BASED_OUTPATIENT_CLINIC_OR_DEPARTMENT_OTHER): Payer: Self-pay

## 2022-12-07 DIAGNOSIS — R072 Precordial pain: Secondary | ICD-10-CM

## 2022-12-07 DIAGNOSIS — R079 Chest pain, unspecified: Secondary | ICD-10-CM | POA: Diagnosis not present

## 2022-12-07 NOTE — Discharge Instructions (Signed)

## 2022-12-07 NOTE — ED Triage Notes (Signed)
Was having chest pain. Pain has subsided. Is currently having neck/shoulder pain.

## 2022-12-07 NOTE — ED Provider Notes (Signed)
[facility] [place] Provider Note   CSN: [id number] Arrival date & time: 12/06/22  2356     History  Chief Complaint  Patient presents with   Chest Pain    Amy Kent is a 42 y.o. female.  The history is provided by the patient.  Patient with history of asthma, obesity presents with multiple complaints. She reports several hours ago she was resting when she started having sharp substernal chest pain.  She then reports she has started having left shoulder pain, pain in her ear and pain in her jaw.  No fevers or vomiting.  No cough or shortness of breath.  She reports since that time all of her symptoms have resolved.  She took an aspirin.  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 recurrent   Migraine headache 10/08/2010   Formatting of this note might be different from the original.  Migraine Headache     10/1 IMO update   Moderate asthma with acute exacerbation 07/13/2022   Other acne 10/20/2008   Formatting of this note might be different from the original. Acne   Other proteinuria 10/31/2022   Palpitations 03/21/2022   Sleep apnea    Mild    Home Medications Prior to Admission medications   Medication Sig Start Date End Date Taking? Authorizing Provider  acetaZOLAMIDE ER (DIAMOX) 500 MG capsule Take 1  capsule (500 mg total) by mouth 2 (two) times daily. Patient not taking: Reported on 12/01/2022 10/25/22   Pieter Partridge, DO  albuterol (VENTOLIN HFA) 108 (90 Base) MCG/ACT inhaler Inhale 2 puffs into the lungs every 6 (six) hours as needed for wheezing or shortness of breath. 08/19/22   Jeanie Sewer, NP  amoxicillin-clavulanate (AUGMENTIN) 875-125 MG tablet Take 1 tablet by mouth 2 (two) times daily after a meal. 12/02/22   Hudnell, Colletta Maryland, NP  budesonide (PULMICORT) 0.5 MG/2ML nebulizer solution TAKE 2 ML (0.5 MG TOTAL) BY NEBULIZATION TWICE A DAY 08/09/22   Hudnell, Colletta Maryland, NP  budesonide-formoterol (SYMBICORT) 80-4.5 MCG/ACT inhaler Inhale 2 puffs into the lungs 2 (two) times daily. Patient not taking: Reported on 12/01/2022 07/13/22   Jeanie Sewer, NP  clonazePAM (KLONOPIN) 1 MG tablet Take 0.5-1 tablets (0.5-1 mg total) by mouth daily as needed for anxiety. 12/01/22   Jeanie Sewer, NP  escitalopram (LEXAPRO) 10 MG tablet Take 1 tablet (10 mg total) by mouth daily. START with 1 pill daily for 2 weeks, then can increase to 2 pills if needed. 12/01/22   Jeanie Sewer, NP  Liraglutide -Weight Management (SAXENDA) 18 MG/3ML SOPN Inject 0.6 mg into the skin daily. Patient not taking: Reported on 12/01/2022 06/20/22   Jeanie Sewer, NP  meclizine (ANTIVERT) 25 MG tablet Take by mouth. 10/08/22   [provider]  montelukast (SINGULAIR) 10 MG  tablet Take 10 mg by mouth at bedtime. Patient not taking: Reported on 12/01/2022    [provider]  omeprazole (PRILOSEC) 20 MG capsule Take 1 capsule (20 mg total) by mouth daily. 06/20/22   Jeanie Sewer, NP  ondansetron (ZOFRAN) 4 MG tablet Take 1 tablet (4 mg total) by mouth every 8 (eight) hours as needed for nausea or vomiting. Patient not taking: Reported on 12/01/2022 06/20/22   Jeanie Sewer, NP  Vitamin D, Ergocalciferol, (DRISDOL) 1.25 MG (50000 UNIT) CAPS capsule Take 1 capsule (50,000 Units total) by  mouth every 7 (seven) days. 10/31/22   Loralee Pacas, MD      Allergies    Patient has no known allergies.    Review of Systems   Review of Systems  Constitutional:  Negative for fever.  Cardiovascular:  Positive for chest pain.  Musculoskeletal:  Positive for neck pain.    Physical Exam Updated Vital Signs BP 128/75 (BP Location: Right Arm)   Pulse 63   Temp 98.1 F (36.7 C) (Oral)   Resp 16   Ht 1.6 m ('5\' 3"'$ )   Wt (!) 140.6 kg   LMP 11/23/2022 (Exact Date)   SpO2 97%   BMI 54.91 kg/m  Physical Exam CONSTITUTIONAL: Well developed/well nourished, resting comfortably HEAD: Normocephalic/atraumatic CV: [id number] noted, no murmurs/rubs/gallops noted ENT-bilateral TMs clear and intact.  No stridor, no drooling, uvula midline No facial swelling or tenderness LUNGS: Lungs are clear to auscultation bilaterally, no apparent distress ABDOMEN: soft, nontender, obese NEURO:Awake/alert, face symmetric, no arm or leg drift is noted Equal 5/5 strength with shoulder abduction, elbow flex/extension, wrist flex/extension in upper extremities and equal hand grips bilaterally Equal 5/5 strength with hip flexion,knee flex/extension, foot dorsi/plantar flexion Cranial nerves 3/4/5/6/04/17/09/11/12 tested and intact Sensation to light touch intact in all extremities EXTREMITIES: pulses normalx4, full ROM No calf tenderness Full range of motion of both upper extremities.  No obvious tenderness elicited SKIN: warm, color normal PSYCH: no abnormalities of mood noted  ED Results / Procedures / Treatments   Labs (all labs ordered are listed, but only abnormal results are displayed) Labs Reviewed - No data to display  EKG EKG Interpretation  Date/Time:  Wednesday December 07 2022 01:24:16 EST Ventricular Rate:  65 PR Interval:  162 QRS Duration: 98 QT Interval:  438 QTC Calculation: 456 R Axis:   79 Text Interpretation: Sinus rhythm Low voltage, precordial leads Confirmed by Ripley Fraise [phone]) on 12/07/2022 1:40:59 AM  Radiology DG Chest Portable 1 View  Result Date: 12/07/2022 CLINICAL DATA:  Chest pain.  Neck and shoulder pain. EXAM: PORTABLE CHEST 1 VIEW COMPARISON:  None Available. FINDINGS: The heart is enlarged and the mediastinal contour is within normal limits. No consolidation, effusion, or pneumothorax. No acute osseous abnormality. IMPRESSION: 1. No active disease. 2. Cardiomegaly. Electronically Signed   By: Brett Fairy M.D.   On: 12/07/2022 00:48    Procedures Procedures    Medications Ordered in ED Medications - No data to display  ED Course/ Medical Decision Making/ A&P                             Medical Decision Making Amount and/or Complexity of Data Reviewed Radiology: ordered. ECG/medicine tests: ordered.   This patient presents to the ED for concern of chest pain, this involves an extensive number of treatment options, and is a complaint that carries with it a high risk of complications and morbidity.  The differential diagnosis includes but is not limited to acute coronary syndrome, aortic dissection, pulmonary embolism, pericarditis, pneumothorax, pneumonia, myocarditis, pleurisy, esophageal rupture   Comorbidities that complicate the patient evaluation: Patient's presentation is complicated by their history of obesity  Additional history obtained: Records reviewed Care Everywhere/External Records and previously seen for inappropriate sinus tachycardia by cardiology   Cardiac Monitoring: The patient was maintained on a cardiac monitor.  I personally viewed and interpreted the cardiac monitor which showed an underlying rhythm of:  sinus rhythm  Test Considered: I considered acute coronary syndrome workup as well as a PE workup, patient declined  Reevaluation: After the interventions noted above, I reevaluated the patient and found that they have :improved  Complexity of problems addressed: Patient's presentation is most  consistent with  acute presentation with potential threat to life or bodily function  Disposition: After consideration of the diagnostic results and the patient's response to treatment,  I feel that the patent would benefit from discharge   .    Patient reports chest pain that went into her shoulder jaw and ear.  By the time my evaluation all of her symptoms had resolved and she was sleeping.  She was in no acute distress.  She only requested a repeat EKG which was unchanged.  I personally visualized the x-ray without any acute findings except mild cardiomegaly.  Patient is requesting discharge does not want any further treatment.  This seems to be a reasonable plan.         Final Clinical Impression(s) / ED Diagnoses Final diagnoses:  Precordial pain    Rx / DC Orders ED Discharge Orders     None         Ripley Fraise, MD 12/07/22 [phone]

## 2022-12-12 ENCOUNTER — Emergency Department (HOSPITAL_BASED_OUTPATIENT_CLINIC_OR_DEPARTMENT_OTHER): Payer: Federal, State, Local not specified - PPO

## 2022-12-12 ENCOUNTER — Other Ambulatory Visit: Payer: Self-pay

## 2022-12-12 ENCOUNTER — Encounter (HOSPITAL_BASED_OUTPATIENT_CLINIC_OR_DEPARTMENT_OTHER): Payer: Self-pay | Admitting: Urology

## 2022-12-12 ENCOUNTER — Emergency Department (HOSPITAL_BASED_OUTPATIENT_CLINIC_OR_DEPARTMENT_OTHER)
Admission: EM | Admit: 2022-12-12 | Discharge: 2022-12-12 | Discharge status: Against Medical Advice | Payer: Federal, State, Local not specified - PPO | Attending: Emergency Medicine | Admitting: Emergency Medicine

## 2022-12-12 DIAGNOSIS — G932 Benign intracranial hypertension: Secondary | ICD-10-CM | POA: Diagnosis not present

## 2022-12-12 DIAGNOSIS — R42 Dizziness and giddiness: Secondary | ICD-10-CM | POA: Diagnosis not present

## 2022-12-12 DIAGNOSIS — R079 Chest pain, unspecified: Secondary | ICD-10-CM | POA: Diagnosis not present

## 2022-12-12 DIAGNOSIS — Z01810 Encounter for preprocedural cardiovascular examination: Secondary | ICD-10-CM | POA: Diagnosis not present

## 2022-12-12 DIAGNOSIS — R0789 Other chest pain: Secondary | ICD-10-CM | POA: Insufficient documentation

## 2022-12-12 DIAGNOSIS — R001 Bradycardia, unspecified: Secondary | ICD-10-CM | POA: Diagnosis not present

## 2022-12-12 DIAGNOSIS — Z5321 Procedure and treatment not carried out due to patient leaving prior to being seen by health care provider: Secondary | ICD-10-CM | POA: Diagnosis not present

## 2022-12-12 LAB — CBC
HCT: 39 % (ref 36.0–46.0)
Hemoglobin: 12.7 g/dL (ref 12.0–15.0)
MCH: 26.6 pg (ref 26.0–34.0)
MCHC: 32.6 g/dL (ref 30.0–36.0)
MCV: 81.8 fL (ref 80.0–100.0)
Platelets: 338 10*3/uL (ref 150–400)
RBC: 4.77 MIL/uL (ref 3.87–5.11)
RDW: 14.9 % (ref 11.5–15.5)
WBC: 9.8 10*3/uL (ref 4.0–10.5)
nRBC: 0 % (ref 0.0–0.2)

## 2022-12-12 LAB — BASIC METABOLIC PANEL
Anion gap: 7 (ref 5–15)
BUN: 16 mg/dL (ref 6–20)
CO2: 23 mmol/L (ref 22–32)
Calcium: 8.7 mg/dL — ABNORMAL LOW (ref 8.9–10.3)
Chloride: 103 mmol/L (ref 98–111)
Creatinine, Ser: 0.85 mg/dL (ref 0.44–1.00)
GFR, Estimated: >60 mL/min (ref >60)
Glucose, Bld: 102 mg/dL — ABNORMAL HIGH (ref 70–99)
Potassium: 3.8 mmol/L (ref 3.5–5.1)
Sodium: 133 mmol/L — ABNORMAL LOW (ref 135–145)

## 2022-12-12 LAB — TROPONIN I (HIGH SENSITIVITY): Troponin I (High Sensitivity): 4 ng/L (ref <18)

## 2022-12-12 NOTE — ED Triage Notes (Signed)
Pt states central chest pain x 1 hr, Denies N/V  Report tightness  Denies SOB

## 2022-12-16 DIAGNOSIS — R001 Bradycardia, unspecified: Secondary | ICD-10-CM | POA: Diagnosis not present

## 2022-12-23 DIAGNOSIS — G4733 Obstructive sleep apnea (adult) (pediatric): Secondary | ICD-10-CM | POA: Diagnosis not present

## 2022-12-23 DIAGNOSIS — F411 Generalized anxiety disorder: Secondary | ICD-10-CM | POA: Diagnosis not present

## 2022-12-23 DIAGNOSIS — F900 Attention-deficit hyperactivity disorder, predominantly inattentive type: Secondary | ICD-10-CM | POA: Diagnosis not present

## 2022-12-26 DIAGNOSIS — H9311 Tinnitus, right ear: Secondary | ICD-10-CM | POA: Diagnosis not present

## 2022-12-26 DIAGNOSIS — R202 Paresthesia of skin: Secondary | ICD-10-CM | POA: Diagnosis not present

## 2022-12-26 DIAGNOSIS — R42 Dizziness and giddiness: Secondary | ICD-10-CM | POA: Diagnosis not present

## 2022-12-30 DIAGNOSIS — R079 Chest pain, unspecified: Secondary | ICD-10-CM | POA: Diagnosis not present

## 2023-01-04 DIAGNOSIS — G43909 Migraine, unspecified, not intractable, without status migrainosus: Secondary | ICD-10-CM | POA: Diagnosis not present

## 2023-01-04 DIAGNOSIS — R42 Dizziness and giddiness: Secondary | ICD-10-CM | POA: Diagnosis not present

## 2023-01-04 DIAGNOSIS — R253 Fasciculation: Secondary | ICD-10-CM | POA: Diagnosis not present

## 2023-01-13 ENCOUNTER — Encounter: Payer: Self-pay | Admitting: Family

## 2023-01-13 ENCOUNTER — Other Ambulatory Visit: Payer: Self-pay | Admitting: Family

## 2023-01-13 DIAGNOSIS — F411 Generalized anxiety disorder: Secondary | ICD-10-CM

## 2023-01-16 DIAGNOSIS — R42 Dizziness and giddiness: Secondary | ICD-10-CM | POA: Diagnosis not present

## 2023-01-16 NOTE — Telephone Encounter (Signed)
Last refill: 12/01/22 #30, 0 Last OV: 12/01/22 dx. Asthma, GAD

## 2023-01-17 ENCOUNTER — Other Ambulatory Visit: Payer: Self-pay

## 2023-01-17 DIAGNOSIS — R42 Dizziness and giddiness: Secondary | ICD-10-CM | POA: Diagnosis not present

## 2023-01-17 DIAGNOSIS — K219 Gastro-esophageal reflux disease without esophagitis: Secondary | ICD-10-CM

## 2023-01-17 DIAGNOSIS — R253 Fasciculation: Secondary | ICD-10-CM | POA: Diagnosis not present

## 2023-01-17 MED ORDER — OMEPRAZOLE 20 MG PO CPDR: 20.0000 mg | DELAYED_RELEASE_CAPSULE | Freq: Every day | ORAL | 1 refills | Status: DC

## 2023-01-17 MED ORDER — CLONAZEPAM 1 MG PO TABS: 0.5000 mg | ORAL_TABLET | Freq: Every day | ORAL | 0 refills | Status: DC | PRN

## 2023-01-17 NOTE — Telephone Encounter (Signed)
RX Sent. 

## 2023-01-18 ENCOUNTER — Telehealth (HOSPITAL_BASED_OUTPATIENT_CLINIC_OR_DEPARTMENT_OTHER): Payer: Federal, State, Local not specified - PPO | Admitting: Psychiatry

## 2023-01-18 ENCOUNTER — Encounter (HOSPITAL_COMMUNITY): Payer: Self-pay | Admitting: Psychiatry

## 2023-01-18 DIAGNOSIS — F411 Generalized anxiety disorder: Secondary | ICD-10-CM | POA: Diagnosis not present

## 2023-01-18 DIAGNOSIS — E559 Vitamin D deficiency, unspecified: Secondary | ICD-10-CM | POA: Diagnosis not present

## 2023-01-18 MED ORDER — FLUOXETINE HCL 10 MG PO CAPS: 10.0000 mg | ORAL_CAPSULE | Freq: Every day | ORAL | 2 refills | Status: DC

## 2023-01-18 NOTE — Progress Notes (Signed)
BH MD/PA/NP OP Progress Note  01/18/2023 11:55 AM Amy Kent  MRN:  [id number]  Visit Diagnosis:    ICD-10-CM   1. GAD (generalized anxiety disorder)  F41.1     2. Vitamin D deficiency  E55.9       Assessment: Amy Kent is a 42 y.o. female with a history of anxiety, iron deficiency anemia, vitamin d deficiency, sleep apnea reported idiopathic intracranial hypertension who presents in person to [facility] for initial evaluation on 10/24/2022.    At initial evaluation patient reported symptoms consistent with anxiety including feeling nervous or on edge, being unable to stop or control her worrying, worrying too much about different things, difficulty relaxing, restlessness, increased irritability, and fears that something awful might happen.  She also had endorsed some increased anhedonia, low mood, difficulty sleeping, and poor appetite over the last year and a half.  Patient denied any SI or thoughts of self-harm but did report having a firearm.  Safety planning and crisis resources were discussed along with firearm safety measures.  Patient has had a diagnosis of borderline personality disorder in the past and endorses symptoms of mood lability, splitting, poor sense of self, and unstable interpersonal relationships.  Of note she also has a number of somatic symptoms including dizziness, paresthesias, loss of sensation, blurry vision, and head pressure.  Patient has had extensive medical workup with some signs consistent with IIH however not enough to meet criteria. She has been following with neurology and her PCP in regards to this.  Patient met criteria for generalized anxiety disorder with further evaluation needed to rule out somatic symptom disorder/conversion disorder.  She also has several traits of borderline personality disorder which will continue to be assessed.  Patient would benefit from starting long-term anxiolytic medications in addition  to regular CPAP use.  Amy Kent presents for follow-up evaluation. Today, 01/18/23, patient reports a period of increased anxiety over the past few months that has since improved.  Patient reports this is largely related to the physical symptoms of vertigo which have diminished following treatment by her neurologist.  Patient does still endorse some anxiety and is interested in trying an alternative medication as she experienced side effects from Cymbalta.  We will start Prozac 10 mg daily and reviewed the risk and benefits.  Patient will also be referred for neuropsych testing.  Plan: - Start Prozac 10 mg QD - Discontinue Cymbalta due to adverse side effects - Continue Klonopin 0.25 mg QD prn for anxiety uses intermittently - Hold Adderall 20 mg QD until neuropsych testing - CMP, CBC, iron panel, Vit D, TSH, Vit B12, LH, FSH reviewed - Continue Vit D replacement therapy - Continue with therapist - Neuropsych testing referral - Recommend CPAP use - Crisis resources reviewed - Follow up in 4-6 weeks   Chief Complaint:  Chief Complaint  Patient presents with   Follow-up   HPI: Amy Kent reports that she is pretty good, just thinks that she may be a little wired up day to day. There was a big bout of anxiety in the interim secondary to her medical concerns. Those have since improved and she has returned to living on her own as opposed to with her parents. Patient connected with a new neurologist who gave her a solumedrol injection which has seemed to improve her vertigo symptoms.  With the improvement of the symptoms for mood symptoms have also improved.  While there has been improvement she does believe that there is some  increased anxiety still.  Patient had tried the Cymbalta however it resulted in GI side effects.  Her PCP had prescribed Lexapro however she became bradycardic after taking it.  Currently she not taking any medication other than Klonopin every week or 2.  Patient is open to  trying new medication and we discussed options.  After reviewing options along with risks/benefits patient was open to starting Prozac 10 mg daily.   Past Psychiatric History: Saw a psychiatrist in the past and was diagnosed with borderline personality disorder. ADHD was later diagnosed by her PCP.  Unstable interpersonal relationships  Some degree of flashbacks and avoidance.    Has tried Lorazepam, Cymbalta (diarrhea), Lexapro was helpful for depression but got sexual side effects (anorgasmia), Effexor (too much mood suppression), only tried Trintellix for a week and has not tried [employer].  Uses marijuana 3-4 times in the past year. Alcohol once a month or so.    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 recurrent   Migraine headache 10/08/2010   Formatting of this note might be different from the original.  Migraine Headache     10/1 IMO update   Moderate asthma with acute exacerbation 07/13/2022   Other acne 10/20/2008   Formatting of this note might be different from the original. Acne   Other proteinuria 10/31/2022   Palpitations 03/21/2022   Sleep apnea    Mild   No past surgical history on file.  Family History:  Family History  Problem Relation Age of Onset   Diabetes Mother    Hypertension Mother    COPD Father     Social History:  Social History   Socioeconomic History   Marital status: Single    Spouse name: Not on file   Number of children: Not on  file   Years of education: Not on file   Highest education level: Not on file  Occupational History   Not on file  Tobacco Use   Smoking status: Former   Smokeless tobacco: Never  Vaping Use   Vaping Use: Never used  Substance and Sexual Activity   Alcohol use: Yes    Comment: socially   Drug use: Yes    Types: Marijuana    Comment: occ   Sexual activity: Not on file  Other Topics Concern   Not on file  Social History Narrative   Not on file   Social Determinants of Health   Financial Resource Strain: Not on file  Food Insecurity: Not on file  Transportation Needs: Not on file  Physical Activity: Not on file  Stress: Not on file  Social Connections: Not on file    Allergies: No Known Allergies  Current Medications: Current Outpatient Medications  Medication Sig Dispense Refill   acetaZOLAMIDE ER (DIAMOX) 500 MG capsule Take 1 capsule (500 mg total) by mouth 2 (two) times daily. (Patient not taking: Reported on 12/01/2022) 60 capsule 5   albuterol (VENTOLIN  HFA) 108 (90 Base) MCG/ACT inhaler Inhale 2 puffs into the lungs every 6 (six) hours as needed for wheezing or shortness of breath. 8 g 5   amoxicillin-clavulanate (AUGMENTIN) 875-125 MG tablet Take 1 tablet by mouth 2 (two) times daily after a meal. 14 tablet 0   budesonide (PULMICORT) 0.5 MG/2ML nebulizer solution TAKE 2 ML (0.5 MG TOTAL) BY NEBULIZATION TWICE A DAY 360 mL 1   budesonide-formoterol (SYMBICORT) 80-4.5 MCG/ACT inhaler Inhale 2 puffs into the lungs 2 (two) times daily. (Patient not taking: Reported on 12/01/2022) 1 each 2   clonazePAM (KLONOPIN) 1 MG tablet Take 0.5-1 tablets (0.5-1 mg total) by mouth daily as needed for anxiety. 30 tablet 0   escitalopram (LEXAPRO) 10 MG tablet Take 1 tablet (10 mg total) by mouth daily. START with 1 pill daily for 2 weeks, then can increase to 2 pills if needed. 60 tablet 1   Liraglutide -Weight Management (SAXENDA) 18 MG/3ML SOPN Inject 0.6 mg into the skin daily.  (Patient not taking: Reported on 12/01/2022) 3 mL 0   meclizine (ANTIVERT) 25 MG tablet Take by mouth.     montelukast (SINGULAIR) 10 MG tablet Take 10 mg by mouth at bedtime. (Patient not taking: Reported on 12/01/2022)     omeprazole (PRILOSEC) 20 MG capsule Take 1 capsule (20 mg total) by mouth daily. 90 capsule 1   ondansetron (ZOFRAN) 4 MG tablet Take 1 tablet (4 mg total) by mouth every 8 (eight) hours as needed for nausea or vomiting. (Patient not taking: Reported on 12/01/2022) 20 tablet 0   Vitamin D, Ergocalciferol, (DRISDOL) 1.25 MG (50000 UNIT) CAPS capsule Take 1 capsule (50,000 Units total) by mouth every 7 (seven) days. 12 capsule 1   No current facility-administered medications for this visit.     Psychiatric Specialty Exam: Review of Systems  There were no vitals taken for this visit.There is no height or weight on file to calculate BMI.  General Appearance: Well Groomed  Eye Contact:  Good  Speech:  Normal Rate  Volume:  Normal  Mood:  Anxious and Euthymic  Affect:  Congruent  Thought Process:  Coherent  Orientation:  Full (Time, Place, and Person)  Thought Content: Logical   Suicidal Thoughts:  No  Homicidal Thoughts:  No  Memory:  Immediate;   Fair  Judgement:  Fair  Insight:  Fair  Psychomotor Activity:  Normal  Concentration:  Concentration: Fair  Recall:  [employer] of Knowledge: Fair  Language: Good  Akathisia:  NA    AIMS (if indicated): not done  Assets:  Communication Skills Desire for Improvement Housing Transportation Vocational/Educational  ADL's:  Intact  Cognition: WNL  Sleep:  Fair   Metabolic Disorder Labs: No results found for: "HGBA1C", "MPG" No results found for: "PROLACTIN" No results found for: "CHOL", "TRIG", "HDL", "CHOLHDL", "VLDL", "LDLCALC" Lab Results  Component Value Date   TSH 1.45 10/20/2022    Therapeutic Level Labs: No results found for: "LITHIUM" No results found for: "VALPROATE" No results found for:  "CBMZ"   Screenings: GAD-7    Flowsheet Row Office Visit from [facility]/20[facility] in [facility] Office Visit from 05[facility]27/2022 in [facility]  Total GAD-7 Score 21 21      PHQ2-9    Flowsheet Row Office Visit from 10/24/2022 in [facility] Office Visit from 05/27/2022 in [facility]  PHQ-2 Total Score 6 2  PHQ-9 Total Score 11 8  Flowsheet Row [facility] 10/24/2022 in [facility]  C-SSRS RISK CATEGORY No Risk No Risk No Risk       Collaboration of Care: Collaboration of Care: Primary Care Provider AEB chart review and Other provider involved in patient's care AEB neurology, audiology, and cardiology chart review  Patient/Guardian was advised Release of Information must be obtained prior to any record release in order to collaborate their care with an outside provider. Patient/Guardian was advised if they have not already done so to contact the registration department to sign all necessary forms in order for us to release information regarding their care.   Consent: Patient/Guardian gives verbal consent for treatment and assignment of benefits for services provided during this visit. Patient/Guardian expressed understanding and agreed to proceed.    Stasia CavalierBrad M Garlen Reinig, MD 01/18/2023, 11:55 AM   Virtual Visit via Video Note  I connected with Amy MuldersJillian Kent on 01/18/23 at  3:00 PM EDT by a video enabled telemedicine application and verified that I am speaking with the correct person using two identifiers.  Location: Patient: Work Provider: [employer]   I discussed the limitations of evaluation and management by telemedicine and the availability of in person  appointments. The patient expressed understanding and agreed to proceed.   I discussed the assessment and treatment plan with the patient. The patient was provided an opportunity to ask questions and all were answered. The patient agreed with the plan and demonstrated an understanding of the instructions.   The patient was advised to call back or seek an in-person evaluation if the symptoms worsen or if the condition fails to improve as anticipated.  I provided 30 minutes of non-face-to-face time during this encounter.   Stasia CavalierBrad M Rehema Muffley, MD

## 2023-01-20 ENCOUNTER — Ambulatory Visit: Payer: Federal, State, Local not specified - PPO | Admitting: Family

## 2023-01-22 DIAGNOSIS — I10 Essential (primary) hypertension: Secondary | ICD-10-CM | POA: Diagnosis not present

## 2023-01-22 DIAGNOSIS — R0681 Apnea, not elsewhere classified: Secondary | ICD-10-CM | POA: Diagnosis not present

## 2023-01-22 DIAGNOSIS — R457 State of emotional shock and stress, unspecified: Secondary | ICD-10-CM | POA: Diagnosis not present

## 2023-01-25 DIAGNOSIS — B977 Papillomavirus as the cause of diseases classified elsewhere: Secondary | ICD-10-CM | POA: Diagnosis not present

## 2023-01-25 DIAGNOSIS — D500 Iron deficiency anemia secondary to blood loss (chronic): Secondary | ICD-10-CM | POA: Diagnosis not present

## 2023-01-25 DIAGNOSIS — E559 Vitamin D deficiency, unspecified: Secondary | ICD-10-CM | POA: Diagnosis not present

## 2023-01-25 DIAGNOSIS — F411 Generalized anxiety disorder: Secondary | ICD-10-CM | POA: Diagnosis not present

## 2023-01-25 DIAGNOSIS — J4530 Mild persistent asthma, uncomplicated: Secondary | ICD-10-CM | POA: Diagnosis not present

## 2023-01-25 DIAGNOSIS — G4733 Obstructive sleep apnea (adult) (pediatric): Secondary | ICD-10-CM | POA: Diagnosis not present

## 2023-01-30 DIAGNOSIS — D500 Iron deficiency anemia secondary to blood loss (chronic): Secondary | ICD-10-CM | POA: Diagnosis not present

## 2023-02-01 DIAGNOSIS — R001 Bradycardia, unspecified: Secondary | ICD-10-CM | POA: Diagnosis not present

## 2023-02-02 DIAGNOSIS — R42 Dizziness and giddiness: Secondary | ICD-10-CM | POA: Diagnosis not present

## 2023-02-06 DIAGNOSIS — D500 Iron deficiency anemia secondary to blood loss (chronic): Secondary | ICD-10-CM | POA: Diagnosis not present

## 2023-02-16 DIAGNOSIS — H8101 Meniere's disease, right ear: Secondary | ICD-10-CM | POA: Diagnosis not present

## 2023-02-16 DIAGNOSIS — G43019 Migraine without aura, intractable, without status migrainosus: Secondary | ICD-10-CM | POA: Diagnosis not present

## 2023-02-22 ENCOUNTER — Other Ambulatory Visit (HOSPITAL_COMMUNITY): Payer: Self-pay | Admitting: Psychiatry

## 2023-02-22 DIAGNOSIS — F411 Generalized anxiety disorder: Secondary | ICD-10-CM

## 2023-03-07 ENCOUNTER — Encounter (HOSPITAL_COMMUNITY): Payer: Federal, State, Local not specified - PPO | Admitting: Psychiatry

## 2023-03-07 NOTE — Progress Notes (Signed)
This encounter was created in error - please disregard.

## 2023-03-07 NOTE — Progress Notes (Deleted)
BH MD/PA/NP OP Progress Note  03/07/2023 1:51 PM Amy Kent  MRN:  [id number]  Visit Diagnosis:  No diagnosis found.   Assessment: Amy Kent is a 42 y.o. female with a history of anxiety, iron deficiency anemia, vitamin d deficiency, sleep apnea reported idiopathic intracranial hypertension who presents in person to [facility] for initial evaluation on 10/24/2022.    At initial evaluation patient reported symptoms consistent with anxiety including feeling nervous or on edge, being unable to stop or control her worrying, worrying too much about different things, difficulty relaxing, restlessness, increased irritability, and fears that something awful might happen.  She also had endorsed some increased anhedonia, low mood, difficulty sleeping, and poor appetite over the last year and a half.  Patient denied any SI or thoughts of self-harm but did report having a firearm.  Safety planning and crisis resources were discussed along with firearm safety measures.  Patient has had a diagnosis of borderline personality disorder in the past and endorses symptoms of mood lability, splitting, poor sense of self, and unstable interpersonal relationships.  Of note she also has a number of somatic symptoms including dizziness, paresthesias, loss of sensation, blurry vision, and head pressure.  Patient has had extensive medical workup with some signs consistent with IIH however not enough to meet criteria. She has been following with neurology and her PCP in regards to this.  Patient met criteria for generalized anxiety disorder with further evaluation needed to rule out somatic symptom disorder/conversion disorder.  She also has several traits of borderline personality disorder which will continue to be assessed.  Patient would benefit from starting long-term anxiolytic medications in addition to regular CPAP use.  [name] presents for follow-up evaluation. Today,  03/07/23, patient reports a period of increased anxiety over the past few months that has since improved.  Patient reports this is largely related to the physical symptoms of vertigo which have diminished following treatment by her neurologist.  Patient does still endorse some anxiety and is interested in trying an alternative medication as she experienced side effects from Cymbalta.  We will start Prozac 10 mg daily and reviewed the risk and benefits.  Patient will also be referred for neuropsych testing.  Plan: - Start Prozac 10 mg QD - Discontinue Cymbalta due to adverse side effects - Continue Klonopin 0.25 mg QD prn for anxiety uses intermittently - Hold Adderall 20 mg QD until neuropsych testing - CMP, CBC, iron panel, Vit D, TSH, Vit B12, LH, FSH reviewed - Continue Vit D replacement therapy - Continue with therapist - Neuropsych testing referral - Recommend CPAP use - Crisis resources reviewed - Follow up in 4-6 weeks   Chief Complaint:  No chief complaint on file.  HPI: [name] reports that she is pretty good, just thinks that she may be a little wired up day to day. There was a big bout of anxiety in the interim secondary to her medical concerns. Those have since improved and she has returned to living on her own as opposed to with her parents. Patient connected with a new neurologist who gave her a solumedrol injection which has seemed to improve her vertigo symptoms.  With the improvement of the symptoms for mood symptoms have also improved.  While there has been improvement she does believe that there is some increased anxiety still.  Patient had tried the Cymbalta however it resulted in GI side effects.  Her PCP had prescribed Lexapro however she became bradycardic after taking  it.  Currently she not taking any medication other than Klonopin every week or 2.  Patient is open to trying new medication and we discussed options.  After reviewing options along with risks/benefits patient  was open to starting Prozac 10 mg daily.   Past Psychiatric History: Saw a psychiatrist in the past and was diagnosed with borderline personality disorder. ADHD was later diagnosed by her PCP.  Unstable interpersonal relationships  Some degree of flashbacks and avoidance.    Has tried Lorazepam, Cymbalta (diarrhea), Lexapro was helpful for depression but got sexual side effects (anorgasmia), Effexor (too much mood suppression), only tried Trintellix for a week and has not tried [employer].  Uses marijuana 3-4 times in the past year. Alcohol once a month or so.    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 recurrent   Migraine headache 10/08/2010   Formatting of this note might be different from the original.  Migraine Headache     10/1 IMO update   Moderate asthma with acute exacerbation 07/13/2022   Other acne 10/20/2008   Formatting of this note might be different from the original. Acne   Other proteinuria 10/31/2022   Palpitations 03/21/2022   Sleep apnea    Mild   No past surgical history on file.  Family History:  Family History  Problem Relation Age of Onset   Diabetes Mother    Hypertension Mother    COPD Father     Social History:  Social History   Socioeconomic History   Marital status: Single    Spouse name: Not on file   Number of children: Not on file   Years of education: Not on file   Highest education level: Bachelor's degree (e.g., BA, AB, BS)   Occupational History   Not on file  Tobacco Use   Smoking status: Former   Smokeless tobacco: Never  Vaping Use   Vaping Use: Never used  Substance and Sexual Activity   Alcohol use: Yes    Comment: socially   Drug use: Yes    Types: Marijuana    Comment: occ   Sexual activity: Not on file  Other Topics Concern   Not on file  Social History Narrative   Not on file   Social Determinants of Health   Financial Resource Strain: Low Risk  (01/19/2023)   Overall Financial Resource Strain (CARDIA)    Difficulty of Paying Living Expenses: Not hard at all  Food Insecurity: No Food Insecurity (01/19/2023)   Hunger Vital Sign    Worried About Running Out of Food in the Last Year: Never true    Ran Out of Food in the Last Year: Never true  Transportation Needs: No Transportation Needs (01/19/2023)   PRAPARE - Administrator, Civil Service (Medical): No    Lack of Transportation (Non-Medical): No  Physical Activity: Insufficiently Active (01/19/2023)   Exercise Vital Sign    Days of Exercise  per Week: 1 day    Minutes of Exercise per Session: 30 min  Stress: Stress Concern Present (01/19/2023)   [employer] of Occupational Health - Occupational Stress Questionnaire    Feeling of Stress : To some extent  Social Connections: Moderately Isolated (01/19/2023)   Social Connection and Isolation Panel [NHANES]    Frequency of Communication with Friends and Family: More than three times a week    Frequency of Social Gatherings with Friends and Family: Once a week    Attends Religious Services: 1 to 4 times per year    Active Member of [employer] or Organizations: No    Attends Engineer, structural: Not on file    Marital Status: Never married    Allergies: No Known Allergies  Current Medications: Current Outpatient Medications  Medication Sig Dispense Refill   acetaZOLAMIDE ER (DIAMOX) 500 MG capsule Take 1 capsule (500 mg total) by mouth 2 (two) times daily.  (Patient not taking: Reported on 12/01/2022) 60 capsule 5   albuterol (VENTOLIN HFA) 108 (90 Base) MCG/ACT inhaler Inhale 2 puffs into the lungs every 6 (six) hours as needed for wheezing or shortness of breath. 8 g 5   amoxicillin-clavulanate (AUGMENTIN) 875-125 MG tablet Take 1 tablet by mouth 2 (two) times daily after a meal. 14 tablet 0   budesonide (PULMICORT) 0.5 MG/2ML nebulizer solution TAKE 2 ML (0.5 MG TOTAL) BY NEBULIZATION TWICE A DAY 360 mL 1   budesonide-formoterol (SYMBICORT) 80-4.5 MCG/ACT inhaler Inhale 2 puffs into the lungs 2 (two) times daily. (Patient not taking: Reported on 12/01/2022) 1 each 2   clonazePAM (KLONOPIN) 1 MG tablet Take 0.5-1 tablets (0.5-1 mg total) by mouth daily as needed for anxiety. 30 tablet 0   FLUoxetine (PROZAC) 10 MG capsule Take 1 capsule (10 mg total) by mouth daily. 30 capsule 2   Liraglutide -Weight Management (SAXENDA) 18 MG/3ML SOPN Inject 0.6 mg into the skin daily. (Patient not taking: Reported on 12/01/2022) 3 mL 0   meclizine (ANTIVERT) 25 MG tablet Take by mouth.     montelukast (SINGULAIR) 10 MG tablet Take 10 mg by mouth at bedtime. (Patient not taking: Reported on 12/01/2022)     omeprazole (PRILOSEC) 20 MG capsule Take 1 capsule (20 mg total) by mouth daily. 90 capsule 1   ondansetron (ZOFRAN) 4 MG tablet Take 1 tablet (4 mg total) by mouth every 8 (eight) hours as needed for nausea or vomiting. (Patient not taking: Reported on 12/01/2022) 20 tablet 0   Vitamin D, Ergocalciferol, (DRISDOL) 1.25 MG (50000 UNIT) CAPS capsule Take 1 capsule (50,000 Units total) by mouth every 7 (seven) days. 12 capsule 1   No current facility-administered medications for this visit.     Psychiatric Specialty Exam: Review of Systems  There were no vitals taken for this visit.There is no height or weight on file to calculate BMI.  General Appearance: Well Groomed  Eye Contact:  Good  Speech:  Normal Rate  Volume:  Normal  Mood:  Anxious and Euthymic   Affect:  Congruent  Thought Process:  Coherent  Orientation:  Full (Time, Place, and Person)  Thought Content: Logical   Suicidal Thoughts:  No  Homicidal Thoughts:  No  Memory:  Immediate;   Fair  Judgement:  Fair  Insight:  Fair  Psychomotor Activity:  Normal  Concentration:  Concentration: Fair  Recall:  [employer] of Knowledge: Fair  Language: Good  Akathisia:  NA    AIMS (if indicated): not done  Assets:  Manufacturing systems engineer Desire for Improvement Housing Transportation Vocational/Educational  ADL's:  Intact  Cognition: WNL  Sleep:  Fair   Metabolic Disorder Labs: No results found for: "HGBA1C", "MPG" No results found for: "PROLACTIN" No results found for: "CHOL", "TRIG", "HDL", "CHOLHDL", "VLDL", "LDLCALC" Lab Results  Component Value Date   TSH 1.45 10/20/2022    Therapeutic Level Labs: No results found for: "LITHIUM" No results found for: "VALPROATE" No results found for: "CBMZ"   Screenings: GAD-7    Flowsheet Row Office Visit from 10/24/2022 in [facility] Office Visit from 05/27/2022 in [facility]  Total GAD-7 Score 21 21      PHQ2-9    Flowsheet Row Office Visit from 10/24/2022 in [facility] Office Visit from 05/27/2022 in [facility]  PHQ-2 Total Score 6 2  PHQ-9 Total Score 11 8      Flowsheet Row ED from 12/12/2022 in [facility] Department at [facility] ED from 12/07/2022 in [facility] Emergency Department at [facility] Office Visit from 10/24/2022 in [facility]  C-SSRS RISK CATEGORY No Risk No Risk No Risk       Collaboration of Care: Collaboration of Care: Primary Care Provider AEB chart review and Other provider involved in patient's care AEB neurology, audiology, and cardiology chart review  Patient/Guardian was advised Release of  Information must be obtained prior to any record release in order to collaborate their care with an outside provider. Patient/Guardian was advised if they have not already done so to contact the registration department to sign all necessary forms in order for [place] to release information regarding their care.   Consent: Patient/Guardian gives verbal consent for treatment and assignment of benefits for services provided during this visit. Patient/Guardian expressed understanding and agreed to proceed.    Stasia Cavalier, MD 03/07/2023, 1:51 PM   Virtual Visit via Video Note  I connected with Colin Mulders on 03/07/23 at  3:30 PM EDT by a video enabled telemedicine application and verified that I am speaking with the correct person using two identifiers.  Location: Patient: Work Provider: Economist   I discussed the limitations of evaluation and management by telemedicine and the availability of in person appointments. The patient expressed understanding and agreed to proceed.   I discussed the assessment and treatment plan with the patient. The patient was provided an opportunity to ask questions and all were answered. The patient agreed with the plan and demonstrated an understanding of the instructions.   The patient was advised to call back or seek an in-person evaluation if the symptoms worsen or if the condition fails to improve as anticipated.  I provided 30 minutes of non-face-to-face time during this encounter.   Stasia Cavalier, MD

## 2023-03-16 ENCOUNTER — Other Ambulatory Visit: Payer: Self-pay | Admitting: Family

## 2023-03-16 DIAGNOSIS — M47816 Spondylosis without myelopathy or radiculopathy, lumbar region: Secondary | ICD-10-CM | POA: Diagnosis not present

## 2023-03-16 DIAGNOSIS — F411 Generalized anxiety disorder: Secondary | ICD-10-CM

## 2023-03-16 DIAGNOSIS — M5136 Other intervertebral disc degeneration, lumbar region: Secondary | ICD-10-CM | POA: Diagnosis not present

## 2023-03-16 DIAGNOSIS — R635 Abnormal weight gain: Secondary | ICD-10-CM

## 2023-03-16 DIAGNOSIS — G894 Chronic pain syndrome: Secondary | ICD-10-CM | POA: Diagnosis not present

## 2023-03-17 ENCOUNTER — Encounter (HOSPITAL_COMMUNITY): Payer: Self-pay | Admitting: Psychiatry

## 2023-03-17 ENCOUNTER — Telehealth (HOSPITAL_BASED_OUTPATIENT_CLINIC_OR_DEPARTMENT_OTHER): Payer: Federal, State, Local not specified - PPO | Admitting: Psychiatry

## 2023-03-17 DIAGNOSIS — F411 Generalized anxiety disorder: Secondary | ICD-10-CM | POA: Diagnosis not present

## 2023-03-17 DIAGNOSIS — E559 Vitamin D deficiency, unspecified: Secondary | ICD-10-CM

## 2023-03-17 DIAGNOSIS — F908 Attention-deficit hyperactivity disorder, other type: Secondary | ICD-10-CM | POA: Diagnosis not present

## 2023-03-17 MED ORDER — ATOMOXETINE HCL 10 MG PO CAPS: 20.0000 mg | ORAL_CAPSULE | Freq: Every day | ORAL | 1 refills | Status: DC

## 2023-03-17 NOTE — Progress Notes (Signed)
BH MD/PA/NP OP Progress Note  03/17/2023 11:37 AM Amy Kent  MRN:  [id number]  Visit Diagnosis:    ICD-10-CM   1. GAD (generalized anxiety disorder)  F41.1     2. Vitamin D deficiency  E55.9     3. ADHD, adult residual type  F90.8 atomoxetine (STRATTERA) 10 MG capsule      Assessment: Amy Kent is a 42 y.o. female with a history of anxiety, iron deficiency anemia, vitamin d deficiency, sleep apnea reported idiopathic intracranial hypertension who presents in person to [facility] at [facility] for initial evaluation on 10/24/2022.    At initial evaluation patient reported symptoms consistent with anxiety including feeling nervous or on edge, being unable to stop or control her worrying, worrying too much about different things, difficulty relaxing, restlessness, increased irritability, and fears that something awful might happen.  She also had endorsed some increased anhedonia, low mood, difficulty sleeping, and poor appetite over the last year and a half.  Patient denied any SI or thoughts of self-harm but did report having a firearm.  Safety planning and crisis resources were discussed along with firearm safety measures.  Patient has had a diagnosis of borderline personality disorder in the past and endorses symptoms of mood lability, splitting, poor sense of self, and unstable interpersonal relationships.  Of note she also has a number of somatic symptoms including dizziness, paresthesias, loss of sensation, blurry vision, and head pressure.  Patient has had extensive medical workup with some signs consistent with IIH however not enough to meet criteria. She has been following with neurology and her PCP in regards to this.  Patient met criteria for generalized anxiety disorder with further evaluation needed to rule out somatic symptom disorder/conversion disorder.  She also has several traits of borderline personality disorder which will continue to be assessed.   Patient would benefit from starting long-term anxiolytic medications in addition to regular CPAP use.  [place] presents for follow-up evaluation. Today, 03/17/23, patient reports that there has been some improvement in her anxiety over the past months as she has gotten some further clarity about her vertigo and dizziness.  She was diagnosed with Mnire's disease and through treatment the symptoms have become much less frequent.  She does take Klonopin intermittently around once or twice a week for these symptoms.  In addition to stay on a low-salt diet and taking her blood pressure medication.  As for her anxiety while it has decreased there is a couple episodes of panic a month.  Patient reports that she is using the Klonopin on average 1-2 times a week currently.  She did not start the Prozac due to not wanting overlap with the medication for Mnire's disease but will start it today.  We will start her on Strattera for concentration symptoms but patient continues to pursue neuropsych testing.  Risk and benefits of Strattera were reviewed.  Plan: - Start Prozac 10 mg QD - Start Atomextine 20 mg QD - Continue Klonopin 0.25 mg QD prn for anxiety uses intermittently - Hold Adderall 20 mg QD until neuropsych testing - CMP, CBC, iron panel, Vit D, TSH, Vit B12, LH, FSH reviewed - Continue Vit D replacement therapy - Continue with therapist - Neuropsych testing referral - Recommend CPAP use - Crisis resources reviewed - Follow up in 4-6 weeks   Chief Complaint:  Chief Complaint  Patient presents with   Follow-up   HPI: Amy Kent reports she is pretty good, things have been going a bit better  for her recently. Can have had some episodes of anxiety which she attempts to get through with breathing techniques. At times that is not enough and she does take a Klonopin. She is taking it around 2 times a week. Severe panic has gone down to a few times a month.   She has not taken the Prozac daily,  because her ENT dx her with meniere disease. She started a medication for that and reports that she was recommended to hold this medication. She has been taking the diuretic and a low salt diet which has helped to improve her dizziness symptoms. When she does have more salt the symptoms seem to be a get worse. She has a sense a relief due to this. Also was recommended to take Klonopin as needed for dizziness, which is an appropriate treatment for Mnire's disease.  She will plan to start Prozac today, now that she has had more clarity about the dizziness issue and the ear issue has improved.  Patient also still reports struggling with ADHD symptoms including poor concentration and difficulty completing tasks.  She has not heard back from the neuropsych testing sites yet.  We discussed nonstimulant options such as atomoxetine which she was open to trying.  We will start taking 20 mg a day and reviewed the risk and benefits.  Past Psychiatric History: Saw a psychiatrist in the past and was diagnosed with borderline personality disorder. ADHD was later diagnosed by her PCP.  Unstable interpersonal relationships  Some degree of flashbacks and avoidance.    Has tried Lorazepam, Cymbalta (diarrhea), Lexapro was helpful for depression but got sexual side effects (anorgasmia), Effexor (too much mood suppression), only tried Trintellix for a week and has not tried [employer].  Uses marijuana 3-4 times in the past year. Alcohol once a month or so.    Past Medical History:  Past Medical History:  Diagnosis Date   Anemia    Anxiety    Asthma    Attention deficit disorder with hyperactivity 08/08/2014   Atypical chest pain 04/05/2011   Bruising 03/24/2022   Chronic anxiety 12/08/2020   DDD (degenerative disc disease), lumbosacral    Depression    DOE (dyspnea on exertion) 03/21/2022   Eustachian tube disorder 12/13/2010   10/1 IMO update   GERD (gastroesophageal reflux disease)    Insomnia 01/05/2021    Lumbar back pain 10/17/2014   Formatting of this note might be different from the original. recurrent   Migraine headache 10/08/2010   Formatting of this note might be different from the original.  Migraine Headache     10/1 IMO update   Moderate asthma with acute exacerbation 07/13/2022   Other acne 10/20/2008   Formatting of this note might be different from the original. Acne   Other proteinuria 10/31/2022   Palpitations 03/21/2022   Sleep apnea    Mild   No past surgical history on file.  Family History:  Family History  Problem Relation Age of Onset   Diabetes Mother    Hypertension Mother    COPD Father     Social History:  Social History   Socioeconomic History   Marital status: Single    Spouse name: Not on file   Number of children: Not on file   Years of education: Not on file   Highest education level: Bachelor's degree (e.g., BA, AB, BS)  Occupational History   Not on file  Tobacco Use   Smoking status: Former   Smokeless tobacco: Never  Vaping Use   Vaping Use: Never used  Substance and Sexual Activity   Alcohol use: Yes    Comment: socially   Drug use: Yes    Types: Marijuana    Comment: occ   Sexual activity: Not on file  Other Topics Concern   Not on file  Social History Narrative   Not on file   Social Determinants of Health   Financial Resource Strain: Low Risk  (01/19/2023)   Overall Financial Resource Strain (CARDIA)    Difficulty of Paying Living Expenses: Not hard at all  Food Insecurity: No Food Insecurity (01/19/2023)   Hunger Vital Sign    Worried About Running Out of Food in the Last Year: Never true    Ran Out of Food in the Last Year: Never true  Transportation Needs: No Transportation Needs (01/19/2023)   PRAPARE - Administrator, Civil Service (Medical): No    Lack of Transportation (Non-Medical): No  Physical Activity: Insufficiently Active (01/19/2023)   Exercise Vital Sign    Days of Exercise per Week: 1 day     Minutes of Exercise per Session: 30 min  Stress: Stress Concern Present (01/19/2023)   [employer] of Occupational Health - Occupational Stress Questionnaire    Feeling of Stress : To some extent  Social Connections: Moderately Isolated (01/19/2023)   Social Connection and Isolation Panel [NHANES]    Frequency of Communication with Friends and Family: More than three times a week    Frequency of Social Gatherings with Friends and Family: Once a week    Attends Religious Services: 1 to 4 times per year    Active Member of [employer] or Organizations: No    Attends Engineer, structural: Not on file    Marital Status: Never married    Allergies: No Known Allergies  Current Medications: Current Outpatient Medications  Medication Sig Dispense Refill   atomoxetine (STRATTERA) 10 MG capsule Take 2 capsules (20 mg total) by mouth daily. 60 capsule 1   acetaZOLAMIDE ER (DIAMOX) 500 MG capsule Take 1 capsule (500 mg total) by mouth 2 (two) times daily. (Patient not taking: Reported on 12/01/2022) 60 capsule 5   albuterol (VENTOLIN HFA) 108 (90 Base) MCG/ACT inhaler Inhale 2 puffs into the lungs every 6 (six) hours as needed for wheezing or shortness of breath. 8 g 5   amoxicillin-clavulanate (AUGMENTIN) 875-125 MG tablet Take 1 tablet by mouth 2 (two) times daily after a meal. 14 tablet 0   budesonide (PULMICORT) 0.5 MG/2ML nebulizer solution TAKE 2 ML (0.5 MG TOTAL) BY NEBULIZATION TWICE A DAY 360 mL 1   budesonide-formoterol (SYMBICORT) 80-4.5 MCG/ACT inhaler Inhale 2 puffs into the lungs 2 (two) times daily. (Patient not taking: Reported on 12/01/2022) 1 each 2   clonazePAM (KLONOPIN) 1 MG tablet Take 0.5-1 tablets (0.5-1 mg total) by mouth daily as needed for anxiety. 30 tablet 0   FLUoxetine (PROZAC) 10 MG capsule Take 1 capsule (10 mg total) by mouth daily. 30 capsule 2   Liraglutide -Weight Management (SAXENDA) 18 MG/3ML SOPN Inject 0.6 mg into the skin daily. (Patient not taking:  Reported on 12/01/2022) 3 mL 0   meclizine (ANTIVERT) 25 MG tablet Take by mouth.     montelukast (SINGULAIR) 10 MG tablet Take 10 mg by mouth at bedtime. (Patient not taking: Reported on 12/01/2022)     omeprazole (PRILOSEC) 20 MG capsule Take 1 capsule (20 mg total) by mouth daily. 90 capsule 1   ondansetron (ZOFRAN)  4 MG tablet Take 1 tablet (4 mg total) by mouth every 8 (eight) hours as needed for nausea or vomiting. (Patient not taking: Reported on 12/01/2022) 20 tablet 0   Vitamin D, Ergocalciferol, (DRISDOL) 1.25 MG (50000 UNIT) CAPS capsule Take 1 capsule (50,000 Units total) by mouth every 7 (seven) days. 12 capsule 1   No current facility-administered medications for this visit.     Psychiatric Specialty Exam: Review of Systems  There were no vitals taken for this visit.There is no height or weight on file to calculate BMI.  General Appearance: Well Groomed  Eye Contact:  Good  Speech:  Normal Rate  Volume:  Normal  Mood:  Euthymic and intermittently anxious  Affect:  Congruent  Thought Process:  Coherent  Orientation:  Full (Time, Place, and Person)  Thought Content: Logical   Suicidal Thoughts:  No  Homicidal Thoughts:  No  Memory:  Immediate;   Fair  Judgement:  Fair  Insight:  Fair  Psychomotor Activity:  Normal  Concentration:  Concentration: Fair  Recall:  [employer] of Knowledge: Fair  Language: Good  Akathisia:  NA    AIMS (if indicated): not done  Assets:  Communication Skills Desire for Improvement Housing Transportation Vocational/Educational  ADL's:  Intact  Cognition: WNL  Sleep:  Fair   Metabolic Disorder Labs: No results found for: "HGBA1C", "MPG" No results found for: "PROLACTIN" No results found for: "CHOL", "TRIG", "HDL", "CHOLHDL", "VLDL", "LDLCALC" Lab Results  Component Value Date   TSH 1.45 10/20/2022    Therapeutic Level Labs: No results found for: "LITHIUM" No results found for: "VALPROATE" No results found for:  "CBMZ"   Screenings: GAD-7    Flowsheet Row [facility] Visit from 10/24/20[facility] in [facility] Office Visit from 05/27/2022 in [facility]  Total GAD-7 Score 21 21      PHQ2-9    Flowsheet Row Office Visit from 10/24/2022 in [facility] Office Visit from 05/27/2022 in [facility]  PHQ-2 Total Score 6 2  PHQ-9 Total Score 11 8      Flowsheet Row ED from 12/12/2022 in [facility] Emergency Department at [facility] ED from 12/07/2022 in [facility] Emergency Department at [facility] Office Visit from 10/24/2022 in [facility]  C-SSRS RISK CATEGORY No Risk No Risk No Risk       Collaboration of Care: Collaboration of Care: Primary Care Provider AEB chart review and Other provider involved in patient's care AEB heme/onc, neurosurgery, and Otolaryngology chart review  Patient/Guardian was advised Release of Information must be obtained prior to any record release in order to collaborate their care with an outside provider. Patient/Guardian was advised if they have not already done so to contact the registration department to sign all necessary forms in order for [place] to release information regarding their care.   Consent: Patient/Guardian gives verbal consent for treatment and assignment of benefits for services provided during this visit. Patient/Guardian expressed understanding and agreed to proceed.    Stasia Cavalier, MD 03/17/2023, 11:37 AM   Virtual Visit via Video Note  I connected with Colin Mulders on 03/17/23 at 11:00 AM EDT by a video enabled telemedicine application and verified that I am speaking with the correct person using two identifiers.  Location: Patient: Work Provider: Economist   I discussed the limitations of evaluation and management by telemedicine and the availability of in person  appointments. The patient  expressed understanding and agreed to proceed.   I discussed the assessment and treatment plan with the patient. The patient was provided an opportunity to ask questions and all were answered. The patient agreed with the plan and demonstrated an understanding of the instructions.   The patient was advised to call back or seek an in-person evaluation if the symptoms worsen or if the condition fails to improve as anticipated.  I provided 30 minutes of non-face-to-face time during this encounter.   Stasia Cavalier, MD

## 2023-03-20 ENCOUNTER — Encounter: Payer: Self-pay | Admitting: Family

## 2023-03-24 ENCOUNTER — Ambulatory Visit: Payer: Federal, State, Local not specified - PPO | Admitting: Family

## 2023-03-24 ENCOUNTER — Encounter: Payer: Self-pay | Admitting: Family

## 2023-03-24 VITALS — BP 116/78 | HR 68 | Temp 97.8°F | Ht 63.0 in | Wt 314.0 lb

## 2023-03-24 DIAGNOSIS — H8101 Meniere's disease, right ear: Secondary | ICD-10-CM | POA: Insufficient documentation

## 2023-03-24 DIAGNOSIS — R635 Abnormal weight gain: Secondary | ICD-10-CM | POA: Diagnosis not present

## 2023-03-24 DIAGNOSIS — F411 Generalized anxiety disorder: Secondary | ICD-10-CM

## 2023-03-24 MED ORDER — SAXENDA 18 MG/3ML ~~LOC~~ SOPN: 0.6000 mg | PEN_INJECTOR | Freq: Every day | SUBCUTANEOUS | 1 refills | Status: DC

## 2023-03-24 MED ORDER — CLONAZEPAM 1 MG PO TABS: 0.5000 mg | ORAL_TABLET | Freq: Every day | ORAL | 2 refills | Status: DC | PRN

## 2023-03-24 NOTE — Assessment & Plan Note (Addendum)
chronic, more stable now seeing PSYCH, switched from Lexapro to Prozac 10mg  qd, tolerating taking Klonopin 0.5-1mg  prn, states it helps her Menierre's, recently DX and started on new daily medication & just takes the Klonopin for breakthru sx 3d/week f/u 3-4 months or when refill needed

## 2023-03-24 NOTE — Progress Notes (Signed)
Patient ID: Amy Kent, female    DOB: [birth year], 42 y.o.   MRN: [id number]  Chief Complaint  Patient presents with   Anxiety   Weight Loss   HPI: Obesity/Abnormal weight gain:  pt reports seeing [facility] in past and started on Saxenda, took for a week but states she could not remember to take daily. Switched to [employer] but she had really bad reflux and nausea so had to stop this after increasing to 0.5mg  dose. Pt had left over Saxenda and has restarted 0.6mg  qd, has taken for the last month, having some heartburn & worried about increasing dose. Wt is up 5lbs from last visit. Anxiety/Depression:  pt reports many year history of anxiety and has failed many meds. She thinks she is very sensitive to medications as even small doses cause side effects: Ativan was most helpful but still caused SOB as well as Hydroxyzine as well as drowsiness even at 10mg  dose, Trazodone- drowsiness during day, Lexapro - anorgasmia. Started on Klonopin prn and Trintellix, pt took samples but states she did not notice any difference. Taking Klonopin prn &  Psych switched from Lexapro to Prozac 10mg  qd states she is tolerating.  Assessment & Plan:  Generalized anxiety disorder Assessment & Plan: chronic, more stable now seeing PSYCH, switched from Lexapro to Prozac 10mg  qd, tolerating taking Klonopin 0.5-1mg  prn, states it helps her Menierre's, recently DX and started on new daily medication & just takes the Klonopin for breakthru sx 3d/week f/u 3-4 months or when refill needed  Orders: -     clonazePAM; Take 0.5-1 tablets (0.5-1 mg total) by mouth daily as needed for anxiety.  Dispense: 30 tablet; Refill: 2  Abnormal weight gain Assessment & Plan: chronic, Unstable pt finally restarted Saxenda about a month ago, still on 0.6mg  qd d/t heartburn, afraid to increase dose wt up 5lbs from last visit advised to increase to 1.2mg  qd and continue taking the Prilosec daily, can increase to 2 pills  daily for sx then go back down advised on other wt loss strategies, good eating habits, increasing daily exercise, drinking 2L water qd f/u in 3 months or when refill needed  Orders: -     Saxenda; Inject 0.6 mg into the skin daily.  Dispense: 3 mL; Refill: 1   Subjective:    Outpatient Medications Prior to Visit  Medication Sig Dispense Refill   albuterol (VENTOLIN HFA) 108 (90 Base) MCG/ACT inhaler Inhale 2 puffs into the lungs every 6 (six) hours as needed for wheezing or shortness of breath. 8 g 5   atomoxetine (STRATTERA) 10 MG capsule Take 2 capsules (20 mg total) by mouth daily. 60 capsule 1   budesonide (PULMICORT) 0.5 MG/2ML nebulizer solution TAKE 2 ML (0.5 MG TOTAL) BY NEBULIZATION TWICE A DAY 360 mL 1   budesonide-formoterol (SYMBICORT) 80-4.5 MCG/ACT inhaler Inhale 2 puffs into the lungs 2 (two) times daily. 1 each 2   FLUoxetine (PROZAC) 10 MG capsule Take 1 capsule (10 mg total) by mouth daily. 30 capsule 2   montelukast (SINGULAIR) 10 MG tablet Take 10 mg by mouth at bedtime.     omeprazole (PRILOSEC) 20 MG capsule Take 1 capsule (20 mg total) by mouth daily. 90 capsule 1   ondansetron (ZOFRAN) 4 MG tablet Take 1 tablet (4 mg total) by mouth every 8 (eight) hours as needed for nausea or vomiting. 20 tablet 0   Vitamin D, Ergocalciferol, (DRISDOL) 1.25 MG (50000 UNIT) CAPS capsule Take 1 capsule (50,000 Units  total) by mouth every 7 (seven) days. 12 capsule 1   acetaZOLAMIDE ER (DIAMOX) 500 MG capsule Take 1 capsule (500 mg total) by mouth 2 (two) times daily. 60 capsule 5   clonazePAM (KLONOPIN) 1 MG tablet Take 0.5-1 tablets (0.5-1 mg total) by mouth daily as needed for anxiety. 30 tablet 0   Liraglutide -Weight Management (SAXENDA) 18 MG/3ML SOPN Inject 0.6 mg into the skin daily. 3 mL 0   triamterene-hydrochlorothiazide (DYAZIDE) 37.5-25 MG capsule Take 1 capsule by mouth daily.     amoxicillin-clavulanate (AUGMENTIN) 875-125 MG tablet Take 1 tablet by mouth 2 (two) times  daily after a meal. (Patient not taking: Reported on 03/24/2023) 14 tablet 0   meclizine (ANTIVERT) 25 MG tablet Take by mouth. (Patient not taking: Reported on 03/24/2023)     No facility-administered medications prior to visit.   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 recurrent   Migraine headache 10/08/2010   Formatting of this note might be different from the original.  Migraine Headache     10/1 IMO update   Moderate asthma with acute exacerbation 07/13/2022   Other acne 10/20/2008   Formatting of this note might be different from the original. Acne   Other proteinuria 10/31/2022   Palpitations 03/21/2022   Severe anxiety with panic 11/22/2020   Sleep apnea    Mild   No past surgical history on file. No Known Allergies    Objective:    Physical Exam Vitals and nursing note reviewed.  Constitutional:      Appearance: Normal appearance. She is obese.  Cardiovascular:     Rate and Rhythm: Normal rate and regular rhythm.  Pulmonary:     Effort: Pulmonary effort is normal.     Breath sounds: Normal breath sounds.  Musculoskeletal:        General: Normal range of motion.  Skin:    General: Skin is warm and dry.  Neurological:     Mental Status: She is alert.  Psychiatric:        Mood and Affect: Mood normal.        Behavior: Behavior normal.    BP 116/78   Pulse 68   Temp 97.8 F (36.6 C) (Temporal)   Ht 5\' 3"  (1.6 m)   Wt (!) 314 lb (142.4 kg)   SpO2 95%   BMI 55.62 kg/m   Wt Readings from Last 3 Encounters:  03/24/23 (!) 314 lb (142.4 kg)  12/12/22 (!) 309 lb 15.5 oz (140.6 kg)  12/07/22 (!) 310 lb (140.6 kg)      Dulce Sellar, NP

## 2023-03-24 NOTE — Assessment & Plan Note (Signed)
chronic, Unstable pt finally restarted Saxenda about a month ago, still on 0.6mg  qd d/t heartburn, afraid to increase dose wt up 5lbs from last visit advised to increase to 1.2mg  qd and continue taking the Prilosec daily, can increase to 2 pills daily for sx then go back down advised on other wt loss strategies, good eating habits, increasing daily exercise, drinking 2L water qd f/u in 3 months or when refill needed

## 2023-03-27 ENCOUNTER — Other Ambulatory Visit: Payer: Self-pay

## 2023-03-27 ENCOUNTER — Encounter: Payer: Self-pay | Admitting: Family

## 2023-03-27 DIAGNOSIS — R635 Abnormal weight gain: Secondary | ICD-10-CM

## 2023-03-27 MED ORDER — SAXENDA 18 MG/3ML ~~LOC~~ SOPN: 0.6000 mg | PEN_INJECTOR | Freq: Every day | SUBCUTANEOUS | 1 refills | Status: AC

## 2023-03-30 ENCOUNTER — Telehealth: Payer: Self-pay

## 2023-03-30 NOTE — Telephone Encounter (Signed)
Saxenda approved through 09/25/23    Approval letter attached to charts

## 2023-03-31 DIAGNOSIS — D500 Iron deficiency anemia secondary to blood loss (chronic): Secondary | ICD-10-CM | POA: Diagnosis not present

## 2023-04-01 ENCOUNTER — Other Ambulatory Visit (HOSPITAL_COMMUNITY): Payer: Self-pay | Admitting: Psychiatry

## 2023-04-01 DIAGNOSIS — F908 Attention-deficit hyperactivity disorder, other type: Secondary | ICD-10-CM

## 2023-04-19 DIAGNOSIS — M47816 Spondylosis without myelopathy or radiculopathy, lumbar region: Secondary | ICD-10-CM | POA: Diagnosis not present

## 2023-04-19 DIAGNOSIS — M5459 Other low back pain: Secondary | ICD-10-CM | POA: Diagnosis not present

## 2023-05-03 ENCOUNTER — Telehealth (HOSPITAL_COMMUNITY): Payer: Federal, State, Local not specified - PPO | Admitting: Psychiatry

## 2023-05-03 ENCOUNTER — Encounter (HOSPITAL_COMMUNITY): Payer: Self-pay

## 2023-05-09 DIAGNOSIS — M9901 Segmental and somatic dysfunction of cervical region: Secondary | ICD-10-CM | POA: Diagnosis not present

## 2023-05-09 DIAGNOSIS — M9904 Segmental and somatic dysfunction of sacral region: Secondary | ICD-10-CM | POA: Diagnosis not present

## 2023-05-09 DIAGNOSIS — M9902 Segmental and somatic dysfunction of thoracic region: Secondary | ICD-10-CM | POA: Diagnosis not present

## 2023-05-09 DIAGNOSIS — M5386 Other specified dorsopathies, lumbar region: Secondary | ICD-10-CM | POA: Diagnosis not present

## 2023-05-09 DIAGNOSIS — M9903 Segmental and somatic dysfunction of lumbar region: Secondary | ICD-10-CM | POA: Diagnosis not present

## 2023-05-17 DIAGNOSIS — M47816 Spondylosis without myelopathy or radiculopathy, lumbar region: Secondary | ICD-10-CM | POA: Diagnosis not present

## 2023-05-30 DIAGNOSIS — H8101 Meniere's disease, right ear: Secondary | ICD-10-CM | POA: Diagnosis not present

## 2023-05-31 DIAGNOSIS — M47816 Spondylosis without myelopathy or radiculopathy, lumbar region: Secondary | ICD-10-CM | POA: Diagnosis not present

## 2023-07-05 DIAGNOSIS — M48061 Spinal stenosis, lumbar region without neurogenic claudication: Secondary | ICD-10-CM | POA: Diagnosis not present

## 2023-07-05 DIAGNOSIS — M4726 Other spondylosis with radiculopathy, lumbar region: Secondary | ICD-10-CM | POA: Diagnosis not present

## 2023-07-05 DIAGNOSIS — M5136 Other intervertebral disc degeneration, lumbar region: Secondary | ICD-10-CM | POA: Diagnosis not present

## 2023-07-05 DIAGNOSIS — M47816 Spondylosis without myelopathy or radiculopathy, lumbar region: Secondary | ICD-10-CM | POA: Diagnosis not present

## 2023-07-05 DIAGNOSIS — M5416 Radiculopathy, lumbar region: Secondary | ICD-10-CM | POA: Diagnosis not present

## 2023-07-05 DIAGNOSIS — M5117 Intervertebral disc disorders with radiculopathy, lumbosacral region: Secondary | ICD-10-CM | POA: Diagnosis not present

## 2023-07-19 ENCOUNTER — Ambulatory Visit: Payer: Federal, State, Local not specified - PPO | Admitting: Family

## 2023-07-19 ENCOUNTER — Other Ambulatory Visit: Payer: Self-pay | Admitting: Internal Medicine

## 2023-07-19 ENCOUNTER — Encounter: Payer: Self-pay | Admitting: Family

## 2023-07-19 VITALS — BP 124/78 | HR 64 | Temp 98.0°F | Ht 63.0 in

## 2023-07-19 DIAGNOSIS — F908 Attention-deficit hyperactivity disorder, other type: Secondary | ICD-10-CM

## 2023-07-19 DIAGNOSIS — D500 Iron deficiency anemia secondary to blood loss (chronic): Secondary | ICD-10-CM

## 2023-07-19 DIAGNOSIS — K219 Gastro-esophageal reflux disease without esophagitis: Secondary | ICD-10-CM

## 2023-07-19 DIAGNOSIS — J4540 Moderate persistent asthma, uncomplicated: Secondary | ICD-10-CM

## 2023-07-19 DIAGNOSIS — Z13220 Encounter for screening for lipoid disorders: Secondary | ICD-10-CM | POA: Diagnosis not present

## 2023-07-19 DIAGNOSIS — Z0000 Encounter for general adult medical examination without abnormal findings: Secondary | ICD-10-CM | POA: Diagnosis not present

## 2023-07-19 DIAGNOSIS — F411 Generalized anxiety disorder: Secondary | ICD-10-CM

## 2023-07-19 DIAGNOSIS — H8101 Meniere's disease, right ear: Secondary | ICD-10-CM

## 2023-07-19 DIAGNOSIS — J45901 Unspecified asthma with (acute) exacerbation: Secondary | ICD-10-CM | POA: Diagnosis not present

## 2023-07-19 DIAGNOSIS — E559 Vitamin D deficiency, unspecified: Secondary | ICD-10-CM | POA: Diagnosis not present

## 2023-07-19 DIAGNOSIS — R7989 Other specified abnormal findings of blood chemistry: Secondary | ICD-10-CM

## 2023-07-19 MED ORDER — BUDESONIDE-FORMOTEROL FUMARATE 80-4.5 MCG/ACT IN AERO: 2.0000 | INHALATION_SPRAY | Freq: Two times a day (BID) | RESPIRATORY_TRACT | 2 refills | Status: AC

## 2023-07-19 MED ORDER — OMEPRAZOLE 20 MG PO CPDR: 20.0000 mg | DELAYED_RELEASE_CAPSULE | Freq: Every day | ORAL | 1 refills | Status: AC

## 2023-07-19 MED ORDER — FLUOXETINE HCL 10 MG PO CAPS: 10.0000 mg | ORAL_CAPSULE | Freq: Every day | ORAL | 1 refills | Status: AC

## 2023-07-19 MED ORDER — CLONAZEPAM 1 MG PO TABS: 0.5000 mg | ORAL_TABLET | Freq: Every day | ORAL | 2 refills | Status: AC | PRN

## 2023-07-19 NOTE — Patient Instructions (Signed)
It was very nice to see you today!   I will review your lab results via MyChart in a few days.   Have a great week!    PLEASE NOTE:  If you had any lab tests please let us know if you have not heard back within a few days. You may see your results on MyChart before we have a chance to review them but we will give you a call once they are reviewed by [place]. If we ordered any referrals today, please let us know if you have not heard from their office within the next week.

## 2023-07-19 NOTE — Progress Notes (Signed)
Phone [phone]  Subjective:   Patient is a 42 y.o. female presenting for annual physical.    Chief Complaint  Patient presents with   Annual Exam    Non fasting w/ labs    *Discussed the use of AI scribe software for clinical note transcription with the patient, who gave verbal consent to proceed.  History of Present Illness   The patient, with a history of Meniere's disease, ADHD, and iron deficiency, presents with ongoing fatigue, dizziness, and anxiety. The patient reports that the fatigue has been persistent for several weeks and is concerned that it may be related to low iron or vitamin D levels. The patient has been taking a liquid iron supplement inconsistently and has recently resumed taking a weekly vitamin D supplement.  The patient's dizziness, a symptom of their Meniere's disease, has been a constant presence for several months. The patient has been managing this with diuretics (triamterene and furosemide) and occasional use of clonazepam. The patient is considering dietary changes, specifically reducing sodium intake, to help manage these symptoms.  The patient's anxiety has been a long-standing issue, currently managed with fluoxetine and Klonopin prn. The patient's psychiatrist recently prescribed Strattera, but the patient has not yet started this medication. The patient expresses a desire to return to taking Adderall for their ADHD, which they believe also helps manage their anxiety and was discontinued earlier in year due to other medical issues pt was having at the time.  The patient also reports persistent heartburn, which they believe may be related to their diet. The patient has been managing this symptom with Omeprazole.     ADHD f/u: Medications helping target goals: Adderall 20mg  IR bid- has been on hold for months, pt wants to resume; Regimen: 4-5 d/week; Medication side effects/concerns: pt denies; Weight: no change; Sleep: no issues; Mood changes: increase in  anxiety; Tics: pt denies Blood pressure, Weight, Pulse, Behavior reviewed: wnl Obesity/Abnormal weight gain:  pt reports seeing [facility] in past and started on Saxenda, took for a week but states she could not remember to take daily. Switched to [employer] but she had really bad reflux and nausea so had to stop this after increasing to 0.5mg  dose. Pt had left over Saxenda and has restarted 0.6mg  qd, has taken for the last month, having some heartburn & worried about increasing dose. Wt is up 5lbs from last visit. Anxiety/Depression:  pt reports many year history of anxiety and has failed many meds. She thinks she is very sensitive to medications as even small doses cause side effects: Ativan was most helpful but still caused SOB as well as Hydroxyzine as well as drowsiness even at 10mg  dose, Trazodone- drowsiness during day, Lexapro - anorgasmia. Started on Klonopin prn and Trintellix, pt took samples but states she did not notice any difference. Taking Klonopin prn &  Psych switched from Lexapro to Prozac 10mg  qd states she is tolerating.  See problem oriented charting- ROS- full  review of systems was completed and negative except for:  noted in HPI above.  The following were reviewed and entered/updated in epic: Past Medical History:  Diagnosis Date   Anemia    Anxiety    Asthma    Attention deficit disorder with hyperactivity 08/08/2014   Atypical chest pain 04/05/2011   Bruising 03/24/2022   Chronic anxiety 12/08/2020   DDD (degenerative disc disease), lumbosacral    Depression    DOE (dyspnea on exertion) 03/21/2022   Eustachian tube disorder 12/13/2010   10/1  IMO update   GERD (gastroesophageal reflux disease)    Insomnia 01/05/2021   Lumbar back pain 10/17/2014   Formatting of this note might be different from the original. recurrent   Migraine headache 10/08/2010   Formatting of this note might be different from the original.  Migraine Headache     10/1 IMO update    Moderate asthma with acute exacerbation 07/13/2022   Other acne 10/20/2008   Formatting of this note might be different from the original. Acne   Other proteinuria 10/31/2022   Palpitations 03/21/2022   Severe anxiety with panic 11/22/2020   Sleep apnea    Mild   Patient Active Problem List   Diagnosis Date Noted   Meniere disease, right 03/24/2023   IIH (idiopathic intracranial hypertension) 10/31/2022   Peripheral neuralgia 07/13/2022   ADHD, adult residual type 06/20/2022   Gastroesophageal reflux disease without esophagitis 06/20/2022   Abnormal weight gain 06/20/2022   Generalized anxiety disorder 05/27/2022   DDD (degenerative disc disease), lumbosacral 05/27/2022   Iron deficiency anemia due to chronic blood loss 03/24/2022   Empty sella turcica (HCC) 03/15/2022   Vitamin D deficiency 01/25/2021   Moderate persistent asthma 11/16/2018   Morbid obesity (HCC) 01/13/2017   Low grade squamous intraepithelial lesion on cytologic smear of cervix (LGSIL) 08/28/2014   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  Family History  Problem Relation Age of Onset   Diabetes Mother    Hypertension Mother    COPD Father     Medications- reviewed and updated Current Outpatient Medications  Medication Sig Dispense Refill   albuterol (VENTOLIN HFA) 108 (90 Base) MCG/ACT inhaler Inhale 2 puffs into the lungs every 6 (six) hours as needed for wheezing or shortness of breath. 8 g 5   budesonide (PULMICORT) 0.5 MG/2ML nebulizer solution TAKE 2 ML (0.5 MG TOTAL) BY NEBULIZATION TWICE A DAY 360 mL 1   furosemide (LASIX) 20 MG tablet Take 20 mg by mouth daily.     Liraglutide -Weight Management (SAXENDA) 18 MG/3ML SOPN Inject 0.6 mg into the skin daily. 3 mL 1   montelukast (SINGULAIR) 10 MG tablet Take 10 mg by mouth at bedtime.     ondansetron (ZOFRAN) 4 MG tablet  Take 1 tablet (4 mg total) by mouth every 8 (eight) hours as needed for nausea or vomiting. 20 tablet 0   triamterene-hydrochlorothiazide (DYAZIDE) 37.5-25 MG capsule Take 1 capsule by mouth daily.     budesonide-formoterol (SYMBICORT) 80-4.5 MCG/ACT inhaler Inhale 2 puffs into the lungs 2 (two) times daily. 1 each 2   clonazePAM (KLONOPIN) 1 MG tablet Take 0.5-1 tablets (0.5-1 mg total) by mouth daily as needed for anxiety. 30 tablet 2   FLUoxetine (PROZAC) 10 MG capsule Take 1 capsule (10 mg total) by mouth daily. 90 capsule 1   omeprazole (PRILOSEC) 20 MG capsule Take 1 capsule (20 mg total) by mouth daily. ok to take bid x 2w as needed, then resume qd 90 capsule 1   Vitamin D, Ergocalciferol, (DRISDOL) 1.25 MG (50000 UNIT) CAPS capsule TAKE 1 CAPSULE (50,000 UNITS TOTAL) BY MOUTH EVERY 7 (SEVEN) DAYS 12 capsule 1   No current facility-administered medications for this visit.    Allergies-reviewed and updated No Known Allergies  Social History   Social History Narrative   Not on file    Objective:  BP 124/78   Pulse  64   Temp 98 F (36.7 C) (Temporal)   Ht 5\' 3"  (1.6 m)   SpO2 94%   BMI 55.62 kg/m  Physical Exam Vitals and nursing note reviewed.  Constitutional:      Appearance: Normal appearance. She is morbidly obese.  HENT:     Head: Normocephalic.     Right Ear: Tympanic membrane normal.     Left Ear: Tympanic membrane normal.     Nose: Nose normal.     Mouth/Throat:     Mouth: Mucous membranes are moist.  Eyes:     Pupils: Pupils are equal, round, and reactive to light.  Cardiovascular:     Rate and Rhythm: Normal rate and regular rhythm.  Pulmonary:     Effort: Pulmonary effort is normal.     Breath sounds: Normal breath sounds.  Musculoskeletal:        General: Normal range of motion.     Cervical back: Normal range of motion.  Lymphadenopathy:     Cervical: No cervical adenopathy.  Skin:    General: Skin is warm and dry.  Neurological:     Mental  Status: She is alert.  Psychiatric:        Mood and Affect: Mood normal.        Behavior: Behavior normal.      Assessment and Plan   Health Maintenance counseling: 1. Anticipatory guidance: Patient counseled regarding regular dental exams q6 months, eye exams,  avoiding smoking and second hand smoke, limiting alcohol to 1 beverage per day, no illicit drugs.   2. Risk factor reduction:  Advised patient of need for regular exercise and diet rich with fruits and vegetables to reduce risk of heart attack and stroke. Exercise- walking.  Wt Readings from Last 3 Encounters:  03/24/23 (!) 314 lb (142.4 kg)  12/12/22 (!) 309 lb 15.5 oz (140.6 kg)  12/07/22 (!) 310 lb (140.6 kg)   3. Immunizations/screenings/ancillary studies Immunization History  Administered Date(s) Administered   HPV 9-valent 09/29/2021   Influenza, Quadrivalent, Recombinant, Inj, Pf 07/27/2016, 06/21/2017, 07/25/2018   Influenza,inj,Quad PF,6+ Mos 09/29/2021, 07/14/2022, 07/18/2023   Influenza-Unspecified 08/08/2014, 07/16/2016, 06/28/2017, 07/23/2017, 12/07/2020   PFIZER Comirnaty(Gray Top)Covid-19 Tri-Sucrose Vaccine 06/08/2020   PFIZER(Purple Top)SARS-COV-2 Vaccination 01/15/2020, 06/08/2020   Tdap 12/06/2010   Health Maintenance Due  Topic Date Due   DTaP/Tdap/Td (2 - Td or Tdap) 12/06/2020   HPV VACCINES (2 - 3-dose SCDM series) 10/27/2021    4. Cervical cancer screening-  due 2028 5. Breast cancer screening-  mammogram - never had, pt to let me know when ready to send order. 6. Colon cancer screening - pt wants to wait until next year, no risk factors. 7. Skin cancer screening- advised regular sunscreen use. Denies worrisome, changing, or new skin lesions.  8. Birth control/STD check- none- N/A 9. Osteoporosis screening- N/A 10. Alcohol screening: rare 11. Smoking associated screening (lung cancer screening, AAA screen 65-75, UA)- non- smoker     Iron Deficiency Anemia - Reports fatigue and has been  taking over-the-counter iron supplements inconsistently. Last iron level check was in January. -Check CBC level today. -Continue to follow with Hematology for ongoing iron infusions and iron labs.  Meniere's Disease - Reports dizziness and has been taking triamterene and furosemide. Also taking Klonopin as needed for symptom relief. -Continue current regimen. -Refill Klonopin prn -Continue dietary modifications to reduce sodium intake.  Anxiety - Reports anxiety exacerbated by dizziness. Currently taking fluoxetine and Klonopin as needed. -Continue current regimen. -Refill Fluoxetine  10mg  daily. -Consider reinitiating Adderall after discussion with psychiatrist.  Asthma - Reports occasional wheezing. Currently using Symbicort inhaler as needed and not consistently taking montelukast. -Consider more consistent use of montelukast, especially during allergy season.  Annual physical exam -     Comprehensive metabolic panel -     Lipid panel -     TSH  Vitamin D deficiency -     VITAMIN D 25 Hydroxy (Vit-D Deficiency, Fractures)  Gastroesophageal reflux disease without esophagitis- chronic, stable prilosec 20mg  qd, sending refill Zofran 4mg  q8h prn f/u 65m or prn  -     Omeprazole; Take 1 capsule (20 mg total) by mouth daily. ok to take bid x 2w as needed, then resume qd  Dispense: 90 capsule; Refill: 1  Iron deficiency anemia due to chronic blood loss  chronic, stable followed by Hematology taking OTC iron during menses receiving iron infusions about q42m last CBC wnl, iron stores were still low checking CBC today will continue to follow  -     IBC + Ferritin  ADHD, adult residual type - Assessment & Plan: chronic hs of  Adderall 20mg  bid but not taken since 10/2022, on hold due to many medical issues and severe anxiety at the time pt doing better now, seen by [facility] in June and started on Strattera but pt never started, really wants to get back on Adderall per Psych notes  in EMR - waiting on neuropsych testing to be done before restarting Adderall f/u prn   Recommended follow up:  Return in about 4 months (around 11/19/2023) for med refills. No future appointments.   Lab/Order associations: fasting   Dulce Sellar, NP

## 2023-07-19 NOTE — Assessment & Plan Note (Addendum)
chronic, stable followed by Hematology taking OTC iron during menses receiving iron infusions about q77m last CBC wnl, iron stores were still low checking IBC/Ferritin today will continue to follow

## 2023-07-19 NOTE — Assessment & Plan Note (Signed)
chronic, stable followed by ENT Triamterene-HCTZ qd, ENT wanted to add Spironolactone for diuretic, but worried about too low BP, sent Lasix instead Lasix 20mg  recently added Klonopin 1mg  prn also helps sx eating low sodium diet checking CMP today will continue to follow

## 2023-07-19 NOTE — Assessment & Plan Note (Addendum)
Chronic, stable Symbicort 80mg  bid, Pulmicort neb solution, using both inconsistently Singulair 10mg  qhs sending refills f/u 6 month

## 2023-07-19 NOTE — Assessment & Plan Note (Signed)
chronic pt on Adderall 20mg  bid, does not take qd PDMP checked & verified sending refill f/u in 3 mos

## 2023-07-19 NOTE — Assessment & Plan Note (Signed)
chronic, stable prilosec 20mg  qd, sending refill Zofran 4mg  q8h prn f/u 46m or prn

## 2023-07-19 NOTE — Assessment & Plan Note (Signed)
chronic, more stable now seeing PSYCH, taking Prozac 10mg  qd, tolerating taking Klonopin 0.5-1mg  prn, states it helps her Menierre's, recently DX and started on new daily medication & just takes the Klonopin for breakthru sx 3d/week sending refills today continue to follow with [facility] f/u 3-4 months or when refill needed

## 2023-07-20 LAB — IBC + FERRITIN
Ferritin: 62 ng/mL (ref 10.0–291.0)
Iron: 46 ug/dL (ref 42–145)
Saturation Ratios: 13.7 % — ABNORMAL LOW (ref 20.0–50.0)
TIBC: 334.6 ug/dL (ref 250.0–450.0)
Transferrin: 239 mg/dL (ref 212.0–360.0)

## 2023-07-20 LAB — LIPID PANEL
Cholesterol: 211 mg/dL — ABNORMAL HIGH (ref 0–200)
HDL: 72.6 mg/dL (ref >39.00)
LDL Cholesterol: 122 mg/dL — ABNORMAL HIGH (ref 0–99)
NonHDL: 138.2
Total CHOL/HDL Ratio: 3
Triglycerides: 81 mg/dL (ref 0.0–149.0)
VLDL: 16.2 mg/dL (ref 0.0–40.0)

## 2023-07-20 LAB — COMPREHENSIVE METABOLIC PANEL
ALT: 14 U/L (ref 0–35)
AST: 17 U/L (ref 0–37)
Albumin: 4 g/dL (ref 3.5–5.2)
Alkaline Phosphatase: 61 U/L (ref 39–117)
BUN: 13 mg/dL (ref 6–23)
CO2: 28 meq/L (ref 19–32)
Calcium: 9.4 mg/dL (ref 8.4–10.5)
Chloride: 101 meq/L (ref 96–112)
Creatinine, Ser: 0.83 mg/dL (ref 0.40–1.20)
GFR: 88.44 mL/min (ref >60.00)
Glucose, Bld: 89 mg/dL (ref 70–99)
Potassium: 4 meq/L (ref 3.5–5.1)
Sodium: 137 meq/L (ref 135–145)
Total Bilirubin: 0.3 mg/dL (ref 0.2–1.2)
Total Protein: 7.2 g/dL (ref 6.0–8.3)

## 2023-07-20 LAB — VITAMIN D 25 HYDROXY (VIT D DEFICIENCY, FRACTURES): VITD: 19.95 ng/mL — ABNORMAL LOW (ref 30.00–100.00)

## 2023-07-20 LAB — TSH: TSH: 1.86 u[IU]/mL (ref 0.35–5.50)

## 2023-07-21 ENCOUNTER — Telehealth: Payer: Self-pay | Admitting: Family

## 2023-07-21 NOTE — Telephone Encounter (Signed)
Pt called [employer] requesting to speak with a Production designer, theatre/television/film. Pt was transferred to me by front office staff. Verified pt name and DOB and advised her that her PCP is at [facility] and that I am not a Production designer, theatre/television/film at that site.   Pt complaint about her PCP visit 07/19/2023. She was not happy with outcome and felt provider was dismissive and they did not have a good connection. Pt stated her PCP discontinued Adderall 10/2022 and referred her to psychiatry and now will no longer prescribe it. Pt states she trusted provider and recommendations for several referrals (ENT, Neurology, Psychiatry) and went to those appointments. Pt wanting chart and medication history to be reviewed.   She is concerned PCP will stop prescribing anxiety medication. Pt explained history of taking xanax, lorazepam, lexapro, and hydroxyzine in the past and they did not work for her.  Pt does not feel she needs another ADHD eval noting she already has diagnosis of ADHD and it doesn't "go away". Also concerned about cost for testing. She added that she hasn't done laundry in 3 months or cleaned dishes in 3 weeks.   Pt was advised info would be shared with appropriate office manager for follow up next week.

## 2023-07-23 MED ORDER — AMPHETAMINE-DEXTROAMPHETAMINE 20 MG PO TABS: 20.0000 mg | ORAL_TABLET | Freq: Every day | ORAL | 0 refills | Status: AC

## 2023-07-25 ENCOUNTER — Encounter: Payer: Self-pay | Admitting: Family

## 2023-07-25 NOTE — Telephone Encounter (Signed)
Stephanie Hudnell followed up with patient in regard.

## 2023-07-27 ENCOUNTER — Other Ambulatory Visit: Payer: Self-pay | Admitting: Family

## 2023-07-27 DIAGNOSIS — F908 Attention-deficit hyperactivity disorder, other type: Secondary | ICD-10-CM

## 2023-07-28 DIAGNOSIS — M4722 Other spondylosis with radiculopathy, cervical region: Secondary | ICD-10-CM | POA: Diagnosis not present

## 2023-07-28 DIAGNOSIS — M546 Pain in thoracic spine: Secondary | ICD-10-CM | POA: Diagnosis not present

## 2023-07-28 DIAGNOSIS — M4804 Spinal stenosis, thoracic region: Secondary | ICD-10-CM | POA: Diagnosis not present

## 2023-07-28 DIAGNOSIS — Z6841 Body Mass Index (BMI) 40.0 and over, adult: Secondary | ICD-10-CM | POA: Diagnosis not present

## 2023-07-31 DIAGNOSIS — F411 Generalized anxiety disorder: Secondary | ICD-10-CM | POA: Diagnosis not present

## 2023-07-31 DIAGNOSIS — Z1330 Encounter for screening examination for mental health and behavioral disorders, unspecified: Secondary | ICD-10-CM | POA: Diagnosis not present

## 2023-07-31 DIAGNOSIS — L249 Irritant contact dermatitis, unspecified cause: Secondary | ICD-10-CM | POA: Diagnosis not present

## 2023-08-10 ENCOUNTER — Telehealth: Payer: Self-pay | Admitting: Family

## 2023-08-10 DIAGNOSIS — F410 Panic disorder [episodic paroxysmal anxiety] without agoraphobia: Secondary | ICD-10-CM | POA: Diagnosis not present

## 2023-08-10 DIAGNOSIS — H8101 Meniere's disease, right ear: Secondary | ICD-10-CM | POA: Diagnosis not present

## 2023-08-10 NOTE — Telephone Encounter (Signed)
PA was processed by CMA.  Amy Kent is good through 07/30/2024.   I have notified patient.

## 2023-08-10 NOTE — Telephone Encounter (Signed)
Patient has called in stating recent script for adderall needs further approval from [facility].  I have left [facility] a VM to call back in regard.    Please check into PA.

## 2023-08-11 ENCOUNTER — Telehealth: Payer: Self-pay

## 2023-08-11 NOTE — Telephone Encounter (Signed)
Patient called requesting to change providers. I informed patient that she has been dismissed from all [employer], but she can still be seen at other primary cares within [facility]. Patient is requesting call back.

## 2023-08-14 DIAGNOSIS — E7800 Pure hypercholesterolemia, unspecified: Secondary | ICD-10-CM | POA: Diagnosis not present

## 2023-08-14 DIAGNOSIS — G4733 Obstructive sleep apnea (adult) (pediatric): Secondary | ICD-10-CM | POA: Diagnosis not present

## 2023-08-14 DIAGNOSIS — F411 Generalized anxiety disorder: Secondary | ICD-10-CM | POA: Diagnosis not present

## 2023-08-14 DIAGNOSIS — Z6841 Body Mass Index (BMI) 40.0 and over, adult: Secondary | ICD-10-CM | POA: Diagnosis not present

## 2023-08-14 DIAGNOSIS — R700 Elevated erythrocyte sedimentation rate: Secondary | ICD-10-CM | POA: Diagnosis not present

## 2023-08-14 DIAGNOSIS — H8101 Meniere's disease, right ear: Secondary | ICD-10-CM | POA: Diagnosis not present

## 2023-08-14 DIAGNOSIS — J309 Allergic rhinitis, unspecified: Secondary | ICD-10-CM | POA: Diagnosis not present

## 2023-08-15 NOTE — Telephone Encounter (Signed)
I have followed up with patient in regard.  Patient states she did not understand why she was dismissed from all practices.  I have informed patient of reason why.   I also have informed patient that she was not dismissed from [facility].  I have informed her of the next [facility] primary care location in our area. Patient was thankful for this information.  States she does not have any other concerns at this time.

## 2023-09-05 DIAGNOSIS — F3132 Bipolar disorder, current episode depressed, moderate: Secondary | ICD-10-CM | POA: Diagnosis not present

## 2023-09-05 DIAGNOSIS — F411 Generalized anxiety disorder: Secondary | ICD-10-CM | POA: Diagnosis not present

## 2023-09-14 DIAGNOSIS — B3731 Acute candidiasis of vulva and vagina: Secondary | ICD-10-CM | POA: Diagnosis not present

## 2023-09-14 DIAGNOSIS — Z124 Encounter for screening for malignant neoplasm of cervix: Secondary | ICD-10-CM | POA: Diagnosis not present

## 2023-09-14 DIAGNOSIS — Z1231 Encounter for screening mammogram for malignant neoplasm of breast: Secondary | ICD-10-CM | POA: Diagnosis not present

## 2023-09-14 DIAGNOSIS — Z01419 Encounter for gynecological examination (general) (routine) without abnormal findings: Secondary | ICD-10-CM | POA: Diagnosis not present

## 2023-09-14 DIAGNOSIS — R87612 Low grade squamous intraepithelial lesion on cytologic smear of cervix (LGSIL): Secondary | ICD-10-CM | POA: Diagnosis not present

## 2023-09-14 DIAGNOSIS — Z113 Encounter for screening for infections with a predominantly sexual mode of transmission: Secondary | ICD-10-CM | POA: Diagnosis not present

## 2023-10-06 DIAGNOSIS — J4530 Mild persistent asthma, uncomplicated: Secondary | ICD-10-CM | POA: Diagnosis not present

## 2023-10-06 DIAGNOSIS — F411 Generalized anxiety disorder: Secondary | ICD-10-CM | POA: Diagnosis not present

## 2023-10-06 DIAGNOSIS — J3089 Other allergic rhinitis: Secondary | ICD-10-CM | POA: Diagnosis not present

## 2023-10-12 DIAGNOSIS — R059 Cough, unspecified: Secondary | ICD-10-CM | POA: Diagnosis not present

## 2023-10-12 DIAGNOSIS — J069 Acute upper respiratory infection, unspecified: Secondary | ICD-10-CM | POA: Diagnosis not present

## 2023-10-20 DIAGNOSIS — R519 Headache, unspecified: Secondary | ICD-10-CM | POA: Diagnosis not present

## 2023-10-23 DIAGNOSIS — F3132 Bipolar disorder, current episode depressed, moderate: Secondary | ICD-10-CM | POA: Diagnosis not present

## 2023-10-23 DIAGNOSIS — R700 Elevated erythrocyte sedimentation rate: Secondary | ICD-10-CM | POA: Diagnosis not present

## 2023-10-23 DIAGNOSIS — F411 Generalized anxiety disorder: Secondary | ICD-10-CM | POA: Diagnosis not present

## 2023-10-23 DIAGNOSIS — R7982 Elevated C-reactive protein (CRP): Secondary | ICD-10-CM | POA: Diagnosis not present

## 2023-11-02 ENCOUNTER — Telehealth: Payer: Self-pay

## 2023-11-02 NOTE — Telephone Encounter (Signed)
*  Primary  Pharmacy Patient Advocate Encounter  Received notification from [facility] that Prior Authorization for Saxenda 18MG /3ML pen-injectors  has been CANCELLED due to patient now seeing [facility] and recent charts show patient is currently on Phentermine for weight loss.    PA #/Case ID/Reference #: [id number]

## 2023-11-02 NOTE — Telephone Encounter (Signed)
Called patient pharmacy and informed of patient using other therapies and to d/c current script for Saxenda

## 2023-11-23 DIAGNOSIS — F411 Generalized anxiety disorder: Secondary | ICD-10-CM | POA: Diagnosis not present

## 2023-11-23 DIAGNOSIS — F3132 Bipolar disorder, current episode depressed, moderate: Secondary | ICD-10-CM | POA: Diagnosis not present

## 2023-11-28 DIAGNOSIS — D500 Iron deficiency anemia secondary to blood loss (chronic): Secondary | ICD-10-CM | POA: Diagnosis not present

## 2023-11-29 DIAGNOSIS — R634 Abnormal weight loss: Secondary | ICD-10-CM | POA: Diagnosis not present

## 2023-11-29 DIAGNOSIS — Z6841 Body Mass Index (BMI) 40.0 and over, adult: Secondary | ICD-10-CM | POA: Diagnosis not present

## 2023-11-29 DIAGNOSIS — E7800 Pure hypercholesterolemia, unspecified: Secondary | ICD-10-CM | POA: Diagnosis not present

## 2023-12-11 DIAGNOSIS — R079 Chest pain, unspecified: Secondary | ICD-10-CM | POA: Diagnosis not present

## 2023-12-11 DIAGNOSIS — R200 Anesthesia of skin: Secondary | ICD-10-CM | POA: Diagnosis not present

## 2023-12-11 DIAGNOSIS — F419 Anxiety disorder, unspecified: Secondary | ICD-10-CM | POA: Diagnosis not present

## 2023-12-11 DIAGNOSIS — M79602 Pain in left arm: Secondary | ICD-10-CM | POA: Diagnosis not present

## 2023-12-11 DIAGNOSIS — E876 Hypokalemia: Secondary | ICD-10-CM | POA: Diagnosis not present

## 2023-12-11 DIAGNOSIS — R000 Tachycardia, unspecified: Secondary | ICD-10-CM | POA: Diagnosis not present

## 2023-12-11 DIAGNOSIS — R0789 Other chest pain: Secondary | ICD-10-CM | POA: Diagnosis not present

## 2023-12-11 DIAGNOSIS — M5412 Radiculopathy, cervical region: Secondary | ICD-10-CM | POA: Diagnosis not present

## 2023-12-11 DIAGNOSIS — Z87891 Personal history of nicotine dependence: Secondary | ICD-10-CM | POA: Diagnosis not present

## 2023-12-11 DIAGNOSIS — I498 Other specified cardiac arrhythmias: Secondary | ICD-10-CM | POA: Diagnosis not present

## 2023-12-15 DIAGNOSIS — M79602 Pain in left arm: Secondary | ICD-10-CM | POA: Diagnosis not present

## 2023-12-15 DIAGNOSIS — Z79899 Other long term (current) drug therapy: Secondary | ICD-10-CM | POA: Diagnosis not present

## 2023-12-15 DIAGNOSIS — R0789 Other chest pain: Secondary | ICD-10-CM | POA: Diagnosis not present

## 2023-12-20 DIAGNOSIS — R87612 Low grade squamous intraepithelial lesion on cytologic smear of cervix (LGSIL): Secondary | ICD-10-CM | POA: Diagnosis not present

## 2023-12-20 DIAGNOSIS — R87810 Cervical high risk human papillomavirus (HPV) DNA test positive: Secondary | ICD-10-CM | POA: Diagnosis not present

## 2023-12-20 DIAGNOSIS — Z01812 Encounter for preprocedural laboratory examination: Secondary | ICD-10-CM | POA: Diagnosis not present

## 2024-01-05 DIAGNOSIS — F411 Generalized anxiety disorder: Secondary | ICD-10-CM | POA: Diagnosis not present

## 2024-01-05 DIAGNOSIS — F3132 Bipolar disorder, current episode depressed, moderate: Secondary | ICD-10-CM | POA: Diagnosis not present

## 2024-01-09 DIAGNOSIS — Z1231 Encounter for screening mammogram for malignant neoplasm of breast: Secondary | ICD-10-CM | POA: Diagnosis not present

## 2024-01-15 DIAGNOSIS — J4530 Mild persistent asthma, uncomplicated: Secondary | ICD-10-CM | POA: Diagnosis not present

## 2024-01-15 DIAGNOSIS — H8101 Meniere's disease, right ear: Secondary | ICD-10-CM | POA: Diagnosis not present

## 2024-01-15 DIAGNOSIS — F410 Panic disorder [episodic paroxysmal anxiety] without agoraphobia: Secondary | ICD-10-CM | POA: Diagnosis not present

## 2024-01-15 DIAGNOSIS — E876 Hypokalemia: Secondary | ICD-10-CM | POA: Diagnosis not present

## 2024-01-15 DIAGNOSIS — F411 Generalized anxiety disorder: Secondary | ICD-10-CM | POA: Diagnosis not present

## 2024-01-17 DIAGNOSIS — R928 Other abnormal and inconclusive findings on diagnostic imaging of breast: Secondary | ICD-10-CM | POA: Diagnosis not present

## 2024-01-17 DIAGNOSIS — R92321 Mammographic fibroglandular density, right breast: Secondary | ICD-10-CM | POA: Diagnosis not present

## 2024-01-17 DIAGNOSIS — N6311 Unspecified lump in the right breast, upper outer quadrant: Secondary | ICD-10-CM | POA: Diagnosis not present

## 2024-01-25 DIAGNOSIS — F411 Generalized anxiety disorder: Secondary | ICD-10-CM | POA: Diagnosis not present

## 2024-01-25 DIAGNOSIS — F3132 Bipolar disorder, current episode depressed, moderate: Secondary | ICD-10-CM | POA: Diagnosis not present

## 2024-04-02 DIAGNOSIS — G4733 Obstructive sleep apnea (adult) (pediatric): Secondary | ICD-10-CM | POA: Diagnosis not present

## 2024-04-02 DIAGNOSIS — G4700 Insomnia, unspecified: Secondary | ICD-10-CM | POA: Diagnosis not present

## 2024-04-18 DIAGNOSIS — G44209 Tension-type headache, unspecified, not intractable: Secondary | ICD-10-CM | POA: Diagnosis not present

## 2024-04-20 DIAGNOSIS — F411 Generalized anxiety disorder: Secondary | ICD-10-CM | POA: Diagnosis not present

## 2024-04-20 DIAGNOSIS — F3132 Bipolar disorder, current episode depressed, moderate: Secondary | ICD-10-CM | POA: Diagnosis not present

## 2024-05-02 DIAGNOSIS — D500 Iron deficiency anemia secondary to blood loss (chronic): Secondary | ICD-10-CM | POA: Diagnosis not present

## 2024-05-02 DIAGNOSIS — E559 Vitamin D deficiency, unspecified: Secondary | ICD-10-CM | POA: Diagnosis not present

## 2024-05-02 DIAGNOSIS — G4733 Obstructive sleep apnea (adult) (pediatric): Secondary | ICD-10-CM | POA: Diagnosis not present

## 2024-05-02 DIAGNOSIS — G4700 Insomnia, unspecified: Secondary | ICD-10-CM | POA: Diagnosis not present

## 2024-05-07 DIAGNOSIS — D500 Iron deficiency anemia secondary to blood loss (chronic): Secondary | ICD-10-CM | POA: Diagnosis not present

## 2024-05-15 DIAGNOSIS — D500 Iron deficiency anemia secondary to blood loss (chronic): Secondary | ICD-10-CM | POA: Diagnosis not present

## 2024-05-27 DIAGNOSIS — F3132 Bipolar disorder, current episode depressed, moderate: Secondary | ICD-10-CM | POA: Diagnosis not present

## 2024-05-27 DIAGNOSIS — F411 Generalized anxiety disorder: Secondary | ICD-10-CM | POA: Diagnosis not present

## 2024-06-02 DIAGNOSIS — G4733 Obstructive sleep apnea (adult) (pediatric): Secondary | ICD-10-CM | POA: Diagnosis not present

## 2024-06-02 DIAGNOSIS — G4700 Insomnia, unspecified: Secondary | ICD-10-CM | POA: Diagnosis not present

## 2024-07-19 DIAGNOSIS — R9431 Abnormal electrocardiogram [ECG] [EKG]: Secondary | ICD-10-CM | POA: Diagnosis not present

## 2024-07-19 DIAGNOSIS — Z131 Encounter for screening for diabetes mellitus: Secondary | ICD-10-CM | POA: Diagnosis not present

## 2024-07-19 DIAGNOSIS — M4802 Spinal stenosis, cervical region: Secondary | ICD-10-CM | POA: Diagnosis not present

## 2024-07-19 DIAGNOSIS — J4520 Mild intermittent asthma, uncomplicated: Secondary | ICD-10-CM | POA: Diagnosis not present

## 2024-07-19 DIAGNOSIS — R739 Hyperglycemia, unspecified: Secondary | ICD-10-CM | POA: Diagnosis not present

## 2024-07-29 DIAGNOSIS — E785 Hyperlipidemia, unspecified: Secondary | ICD-10-CM | POA: Diagnosis not present

## 2024-07-29 DIAGNOSIS — E559 Vitamin D deficiency, unspecified: Secondary | ICD-10-CM | POA: Diagnosis not present

## 2024-07-29 DIAGNOSIS — T502X5A Adverse effect of carbonic-anhydrase inhibitors, benzothiadiazides and other diuretics, initial encounter: Secondary | ICD-10-CM | POA: Diagnosis not present

## 2024-07-29 DIAGNOSIS — Z23 Encounter for immunization: Secondary | ICD-10-CM | POA: Diagnosis not present

## 2024-07-29 DIAGNOSIS — E876 Hypokalemia: Secondary | ICD-10-CM | POA: Diagnosis not present

## 2024-07-29 DIAGNOSIS — H8101 Meniere's disease, right ear: Secondary | ICD-10-CM | POA: Diagnosis not present

## 2024-07-29 DIAGNOSIS — Z0000 Encounter for general adult medical examination without abnormal findings: Secondary | ICD-10-CM | POA: Diagnosis not present

## 2024-08-15 DIAGNOSIS — D500 Iron deficiency anemia secondary to blood loss (chronic): Secondary | ICD-10-CM | POA: Diagnosis not present

## 2024-08-19 DIAGNOSIS — F3132 Bipolar disorder, current episode depressed, moderate: Secondary | ICD-10-CM | POA: Diagnosis not present

## 2024-08-27 DIAGNOSIS — F411 Generalized anxiety disorder: Secondary | ICD-10-CM | POA: Diagnosis not present

## 2024-08-27 DIAGNOSIS — R000 Tachycardia, unspecified: Secondary | ICD-10-CM | POA: Diagnosis not present

## 2024-08-27 DIAGNOSIS — R0789 Other chest pain: Secondary | ICD-10-CM | POA: Diagnosis not present

## 2024-09-11 DIAGNOSIS — E785 Hyperlipidemia, unspecified: Secondary | ICD-10-CM | POA: Diagnosis not present

## 2024-09-11 DIAGNOSIS — R000 Tachycardia, unspecified: Secondary | ICD-10-CM | POA: Diagnosis not present

## 2024-09-11 DIAGNOSIS — R0789 Other chest pain: Secondary | ICD-10-CM | POA: Diagnosis not present

## 2024-09-11 DIAGNOSIS — R079 Chest pain, unspecified: Secondary | ICD-10-CM | POA: Diagnosis not present

## 2024-09-11 DIAGNOSIS — F411 Generalized anxiety disorder: Secondary | ICD-10-CM | POA: Diagnosis not present
